# Patient Record
Sex: Female | Born: 1985 | Race: White | Hispanic: No | Marital: Single | Smoking: Current every day smoker
Health system: Southern US, Community
[De-identification: ages and names within clinical notes are randomized; demographics above are authoritative.]

## PROBLEM LIST (undated history)

## (undated) DIAGNOSIS — N939 Abnormal uterine and vaginal bleeding, unspecified: Secondary | ICD-10-CM

## (undated) DIAGNOSIS — F509 Eating disorder, unspecified: Secondary | ICD-10-CM

## (undated) DIAGNOSIS — R002 Palpitations: Secondary | ICD-10-CM

## (undated) DIAGNOSIS — K589 Irritable bowel syndrome without diarrhea: Secondary | ICD-10-CM

## (undated) DIAGNOSIS — F319 Bipolar disorder, unspecified: Secondary | ICD-10-CM

## (undated) HISTORY — PX: WISDOM TOOTH EXTRACTION: SHX21

---

## 2003-01-21 ENCOUNTER — Encounter: Admission: RE | Admit: 2003-01-21 | Discharge: 2003-04-21 | Payer: Self-pay | Admitting: Psychology

## 2003-07-06 ENCOUNTER — Emergency Department (HOSPITAL_COMMUNITY): Admission: AD | Admit: 2003-07-06 | Discharge: 2003-07-06 | Payer: Self-pay | Admitting: Family Medicine

## 2003-11-04 ENCOUNTER — Other Ambulatory Visit: Admission: RE | Admit: 2003-11-04 | Discharge: 2003-11-04 | Payer: Self-pay | Admitting: Obstetrics and Gynecology

## 2004-11-11 ENCOUNTER — Emergency Department (HOSPITAL_COMMUNITY): Admission: EM | Admit: 2004-11-11 | Discharge: 2004-11-11 | Payer: Self-pay | Admitting: Emergency Medicine

## 2014-02-22 ENCOUNTER — Emergency Department: Payer: Self-pay | Admitting: Emergency Medicine

## 2014-02-24 LAB — BETA STREP CULTURE(ARMC)

## 2014-04-17 ENCOUNTER — Emergency Department: Payer: Self-pay | Admitting: Emergency Medicine

## 2014-04-17 LAB — URINALYSIS, COMPLETE
Bacteria: NONE SEEN
Bilirubin,UR: NEGATIVE
Blood: NEGATIVE
Glucose,UR: NEGATIVE mg/dL (ref 0–75)
KETONE: NEGATIVE
NITRITE: NEGATIVE
PROTEIN: NEGATIVE
Ph: 6 (ref 4.5–8.0)
Specific Gravity: 1.016 (ref 1.003–1.030)
Squamous Epithelial: 3

## 2014-04-17 LAB — CBC
HCT: 43 % (ref 35.0–47.0)
HGB: 14.3 g/dL (ref 12.0–16.0)
MCH: 32.6 pg (ref 26.0–34.0)
MCHC: 33.2 g/dL (ref 32.0–36.0)
MCV: 98 fL (ref 80–100)
Platelet: 235 10*3/uL (ref 150–440)
RBC: 4.38 10*6/uL (ref 3.80–5.20)
RDW: 13.8 % (ref 11.5–14.5)
WBC: 8.4 10*3/uL (ref 3.6–11.0)

## 2014-04-17 LAB — WET PREP, GENITAL

## 2014-04-17 LAB — PREGNANCY, URINE: Pregnancy Test, Urine: NEGATIVE m[IU]/mL

## 2014-04-17 LAB — GC/CHLAMYDIA PROBE AMP

## 2014-08-30 ENCOUNTER — Emergency Department
Admission: EM | Admit: 2014-08-30 | Discharge: 2014-08-30 | Disposition: A | Discharge status: Home / Self Care | Payer: Medicaid Other | Attending: Emergency Medicine | Admitting: Emergency Medicine

## 2014-08-30 ENCOUNTER — Encounter: Payer: Self-pay | Admitting: Emergency Medicine

## 2014-08-30 DIAGNOSIS — S0501XA Injury of conjunctiva and corneal abrasion without foreign body, right eye, initial encounter: Secondary | ICD-10-CM | POA: Insufficient documentation

## 2014-08-30 DIAGNOSIS — Y9389 Activity, other specified: Secondary | ICD-10-CM | POA: Diagnosis not present

## 2014-08-30 DIAGNOSIS — W228XXA Striking against or struck by other objects, initial encounter: Secondary | ICD-10-CM | POA: Insufficient documentation

## 2014-08-30 DIAGNOSIS — Z791 Long term (current) use of non-steroidal anti-inflammatories (NSAID): Secondary | ICD-10-CM | POA: Diagnosis not present

## 2014-08-30 DIAGNOSIS — Y9289 Other specified places as the place of occurrence of the external cause: Secondary | ICD-10-CM | POA: Insufficient documentation

## 2014-08-30 DIAGNOSIS — S0591XA Unspecified injury of right eye and orbit, initial encounter: Secondary | ICD-10-CM | POA: Diagnosis present

## 2014-08-30 DIAGNOSIS — Z720 Tobacco use: Secondary | ICD-10-CM | POA: Insufficient documentation

## 2014-08-30 DIAGNOSIS — Y998 Other external cause status: Secondary | ICD-10-CM | POA: Diagnosis not present

## 2014-08-30 MED ORDER — TETRACAINE HCL 0.5 % OP SOLN
OPHTHALMIC | Status: AC
  Filled 2014-08-30: qty 2

## 2014-08-30 MED ORDER — ACETAMINOPHEN-CODEINE #4 300-60 MG PO TABS: 1.0000 | ORAL_TABLET | ORAL | Status: DC | PRN

## 2014-08-30 MED ORDER — CIPROFLOXACIN HCL 0.3 % OP SOLN
2.0000 [drp] | OPHTHALMIC | Status: DC
  Administered 2014-08-30: 2 [drp] via OPHTHALMIC

## 2014-08-30 MED ORDER — FLUORESCEIN SODIUM 1 MG OP STRP
ORAL_STRIP | OPHTHALMIC | Status: AC
  Filled 2014-08-30: qty 1

## 2014-08-30 MED ORDER — CIPROFLOXACIN HCL 0.3 % OP SOLN
OPHTHALMIC | Status: AC
  Filled 2014-08-30: qty 2.5

## 2014-08-30 MED ORDER — KETOROLAC TROMETHAMINE 0.5 % OP SOLN: 1.0000 [drp] | Freq: Four times a day (QID) | OPHTHALMIC | Status: DC

## 2014-08-30 NOTE — ED Provider Notes (Signed)
[facility] Emergency Department Provider Note  ____________________________________________  Time seen: Approximately 4:33 PM  I have reviewed the triage vital signs and the nursing notes.   HISTORY  Chief Complaint Eye Pain    HPI Jody Roberts is a 29 y.o. female who presents to the emergency department for pain to the right eye. She reports that she accidentally scraped it on a cardboard box this morning around 10 AM. She reports the pain has increased throughout the day. She describes a feeling of foreign body. She denies change in vision, however she reports that the eye is excruciatingly painful and is watering. She does not wear contacts. She was not wearing glasses when the incident occurred. She states that this happened at work.   No past medical history on file.  There are no active problems to display for this patient.   No past surgical history on file.  Current Outpatient Rx  Name  Route  Sig  Dispense  Refill  . acetaminophen-codeine (TYLENOL #4) 300-60 MG per tablet   Oral   Take 1 tablet by mouth every 4 (four) hours as needed for moderate pain.   12 tablet   0   . ketorolac (ACULAR) 0.5 % ophthalmic solution   Right Eye   Place 1 drop into the right eye 4 (four) times daily.   5 mL   0     Allergies Review of patient's allergies indicates no known allergies.  No family history on file.  Social History History  Substance Use Topics  . Smoking status: Current Every Day Smoker  . Smokeless tobacco: Not on file  . Alcohol Use: No    Review of Systems   Constitutional: No fever/chills Eyes: Visual changes: no. ENT: No sore throat. Cardiovascular: Denies chest pain. Respiratory: Denies shortness of breath. Gastrointestinal: No abdominal pain.  No nausea, no vomiting.  No diarrhea.  No constipation. Musculoskeletal: Negative for pain. Skin: Negative for rash. Neurological: Negative for headaches, focal weakness  or numbness. Psychiatric:At baseline, no complaint Lymphatic:Swollen nodes-- no Allergic: Seasonal allergies: no 10-point ROS otherwise negative.  ____________________________________________  PHYSICAL EXAM:  VITAL SIGNS: ED Triage Vitals  Enc Vitals Group     BP 08/30/14 1504 132/95 mmHg     Pulse Rate 08/30/14 1504 105     Resp 08/30/14 1504 18     Temp 08/30/14 1504 99.5 F (37.5 C)     Temp Source 08/30/14 1504 Oral     SpO2 08/30/14 1504 99 %     Weight 08/30/14 1504 115 lb (52.164 kg)     Height 08/30/14 1504 5\' 2"  (1.575 m)     Head Cir --      Peak Flow --      Pain Score 08/30/14 1505 9     Pain Loc --      Pain Edu? --      Excl. in GC? --     Constitutional: Alert and oriented. Well appearing and in no acute distress. Eyes: Visual acuity--see nursing documentation; No globe trauma; Eyelids normal to inspection; Everted for exam yes; Conjunctiva and sclera: Erythematous; Corneas: Corneal abrasion noted at the 11:00 position; Examined with fluorescein yes; EOM's intact; Pupils PERRLA; Anterior Chambers normal with limited exam.  Head: Atraumatic. Nose: No congestion/rhinnorhea. Mouth/Throat: Mucous membranes are moist.  Oropharynx non-erythematous. Neck: No stridor.  Cardiovascular:  Good peripheral circulation. Respiratory: Normal respiratory effort.  No retractions. Gastrointestinal: Soft and nontender. No distention.  Musculoskeletal:Normal ROM Neurologic:  Normal speech and language. No gross focal neurologic deficits are appreciated. Speech is normal. No gait instability. Skin:  Skin is warm, dry and intact. No rash noted. Psychiatric: Mood and affect are normal. Speech and behavior are normal.  ____________________________________________   LABS (all labs ordered are listed, but only abnormal results are displayed)  Labs Reviewed - No data to  display ____________________________________________  EKG   ____________________________________________  RADIOLOGY   ____________________________________________   PROCEDURES  Procedure(s) performed: None  ____________________________________________   INITIAL IMPRESSION / ASSESSMENT AND PLAN / ED COURSE  Pertinent labs & imaging results that were available during my care of the patient were reviewed by me and considered in my medical decision making (see chart for details).  Patient was advised to follow-up with ophthalmology in the next 2 days. She was advised to return to the emergency department for any symptom changes or worsen if stable schedule an appointment. ____________________________________________   FINAL CLINICAL IMPRESSION(S) / ED DIAGNOSES  Final diagnoses:  Corneal abrasion, right, initial encounter      Chinita Pester, FNP 08/30/14 1637  Loleta Rose, MD 08/31/14 0025

## 2014-08-30 NOTE — Discharge Instructions (Signed)
Corneal Abrasion °The cornea is the clear covering at the front and center of the eye. When looking at the colored portion of the eye (iris), you are looking through the cornea. This very thin tissue is made up of many layers. The surface layer is a single layer of cells (corneal epithelium) and is one of the most sensitive tissues in the body. If a scratch or injury causes the corneal epithelium to come off, it is called a corneal abrasion. If the injury extends to the tissues below the epithelium, the condition is called a corneal ulcer. °CAUSES  °· Scratches. °· Trauma. °· Foreign body in the eye. °Some people have recurrences of abrasions in the area of the original injury even after it has healed (recurrent erosion syndrome). Recurrent erosion syndrome generally improves and goes away with time. °SYMPTOMS  °· Eye pain. °· Difficulty or inability to keep the injured eye open. °· The eye becomes very sensitive to light. °· Recurrent erosions tend to happen suddenly, first thing in the morning, usually after waking up and opening the eye. °DIAGNOSIS  °Your health care provider can diagnose a corneal abrasion during an eye exam. Dye is usually placed in the eye using a drop or a small paper strip moistened by your tears. When the eye is examined with a special light, the abrasion shows up clearly because of the dye. °TREATMENT  °· Small abrasions may be treated with antibiotic drops or ointment alone. °· A pressure patch may be put over the eye. If this is done, follow your doctor's instructions for when to remove the patch. Do not drive or use machines while the eye patch is on. Judging distances is hard to do with a patch on. °If the abrasion becomes infected and spreads to the deeper tissues of the cornea, a corneal ulcer can result. This is serious because it can cause corneal scarring. Corneal scars interfere with light passing through the cornea and cause a loss of vision in the involved eye. °HOME CARE  INSTRUCTIONS °· Use medicine or ointment as directed. Only take over-the-counter or prescription medicines for pain, discomfort, or fever as directed by your health care provider. °· Do not drive or operate machinery if your eye is patched. Your ability to judge distances is impaired. °· If your health care provider has given you a follow-up appointment, it is very important to keep that appointment. Not keeping the appointment could result in a severe eye infection or permanent loss of vision. If there is any problem keeping the appointment, let your health care provider know. °SEEK MEDICAL CARE IF:  °· You have pain, light sensitivity, and a scratchy feeling in one eye or both eyes. °· Your pressure patch keeps loosening up, and you can blink your eye under the patch after treatment. °· Any kind of discharge develops from the eye after treatment or if the lids stick together in the morning. °· You have the same symptoms in the morning as you did with the original abrasion days, weeks, or months after the abrasion healed. °MAKE SURE YOU:  °· Understand these instructions. °· Will watch your condition. °· Will get help right away if you are not doing well or get worse. °Document Released: 03/23/2000 Document Revised: 03/31/2013 Document Reviewed: 12/01/2012 °[employer]® Patient Information ©2015 [employer]. This information is not intended to replace advice given to you by your health care provider. Make sure you discuss any questions you have with your health care provider. ° °

## 2014-08-30 NOTE — ED Notes (Signed)
Was bending over today hit right eye on corner of box, occurred at work but not w/c

## 2014-08-30 NOTE — ED Notes (Signed)
C/o right eye pain, states she went to get a box and the corner of the box went into her right eye

## 2014-12-29 ENCOUNTER — Encounter
Admission: RE | Admit: 2014-12-29 | Discharge: 2014-12-29 | Disposition: A | Discharge status: Home / Self Care | Payer: Medicaid Other | Source: Ambulatory Visit | Attending: Obstetrics & Gynecology | Admitting: Obstetrics & Gynecology

## 2014-12-29 DIAGNOSIS — N921 Excessive and frequent menstruation with irregular cycle: Secondary | ICD-10-CM | POA: Insufficient documentation

## 2014-12-29 DIAGNOSIS — Z01818 Encounter for other preprocedural examination: Secondary | ICD-10-CM | POA: Insufficient documentation

## 2014-12-29 HISTORY — DX: Palpitations: R00.2

## 2014-12-29 HISTORY — DX: Irritable bowel syndrome, unspecified: K58.9

## 2014-12-29 HISTORY — DX: Bipolar disorder, unspecified: F31.9

## 2014-12-29 HISTORY — DX: Abnormal uterine and vaginal bleeding, unspecified: N93.9

## 2014-12-29 LAB — CBC
HCT: 43.9 % (ref 35.0–47.0)
Hemoglobin: 14.9 g/dL (ref 12.0–16.0)
MCH: 32.1 pg (ref 26.0–34.0)
MCHC: 34 g/dL (ref 32.0–36.0)
MCV: 94.4 fL (ref 80.0–100.0)
PLATELETS: 320 10*3/uL (ref 150–440)
RBC: 4.65 MIL/uL (ref 3.80–5.20)
RDW: 13.3 % (ref 11.5–14.5)
WBC: 11 10*3/uL (ref 3.6–11.0)

## 2014-12-29 LAB — ABO/RH: ABO/RH(D): A POS

## 2014-12-29 LAB — TYPE AND SCREEN
ABO/RH(D): A POS
Antibody Screen: NEGATIVE

## 2014-12-29 NOTE — Patient Instructions (Signed)
  Your procedure is scheduled on: 01/06/15 Thur   Report to Day Surgery. To find out your arrival time please call [phone] between 1PM - 3PM on 01/05/15 Wed.  Remember: Instructions that are not followed completely may result in serious medical risk, up to and including death, or upon the discretion of your surgeon and anesthesiologist your surgery may need to be rescheduled.    _x__ 1. Do not eat food or drink liquids after midnight. No gum chewing or hard candies.     _x___ 2. No Alcohol for 24 hours before or after surgery.   ____ 3. Bring all medications with you on the day of surgery if instructed.    __x__ 4. Notify your doctor if there is any change in your medical condition     (cold, fever, infections).     Do not wear jewelry, make-up, hairpins, clips or nail polish.  Do not wear lotions, powders, or perfumes. You may wear deodorant.  Do not shave 48 hours prior to surgery. Men may shave face and neck.  Do not bring valuables to the hospital.    [facility] is not responsible for any belongings or valuables.               Contacts, dentures or bridgework may not be worn into surgery.  Leave your suitcase in the car. After surgery it may be brought to your room.  For patients admitted to the hospital, discharge time is determined by your                treatment team.   Patients discharged the day of surgery will not be allowed to drive home.   Please read over the following fact sheets that you were given:      _x___ Take these medicines the morning of surgery with A SIP OF WATER:    1. Vortioxetine HBr (TRINTELLIX) 10 MG TABS  2.   3.   4.  5.  6.  ____ Fleet Enema (as directed)   __x__ Use CHG Soap as directed  ____ Use inhalers on the day of surgery  ____ Stop metformin 2 days prior to surgery    ____ Take 1/2 of usual insulin dose the night before surgery and none on the morning of surgery.   ____ Stop Coumadin/Plavix/aspirin on   ____ Stop  Anti-inflammatories on    ____ Stop supplements until after surgery.    ____ Bring C-Pap to the hospital.

## 2015-01-06 ENCOUNTER — Ambulatory Visit
Admission: RE | Admit: 2015-01-06 | Discharge: 2015-01-06 | Disposition: A | Discharge status: Home / Self Care | Payer: Medicaid Other | Source: Ambulatory Visit | Attending: Obstetrics & Gynecology | Admitting: Obstetrics & Gynecology

## 2015-01-06 ENCOUNTER — Ambulatory Visit: Payer: Medicaid Other | Admitting: Anesthesiology

## 2015-01-06 ENCOUNTER — Encounter
Admission: RE | Disposition: A | Discharge status: Home / Self Care | Payer: Self-pay | Source: Ambulatory Visit | Attending: Obstetrics & Gynecology

## 2015-01-06 DIAGNOSIS — N921 Excessive and frequent menstruation with irregular cycle: Secondary | ICD-10-CM | POA: Insufficient documentation

## 2015-01-06 DIAGNOSIS — N840 Polyp of corpus uteri: Secondary | ICD-10-CM | POA: Diagnosis not present

## 2015-01-06 DIAGNOSIS — N72 Inflammatory disease of cervix uteri: Secondary | ICD-10-CM | POA: Diagnosis not present

## 2015-01-06 DIAGNOSIS — F319 Bipolar disorder, unspecified: Secondary | ICD-10-CM | POA: Diagnosis not present

## 2015-01-06 HISTORY — PX: LAPAROSCOPIC HYSTERECTOMY: SHX1926

## 2015-01-06 HISTORY — PX: CYSTOSCOPY: SHX5120

## 2015-01-06 LAB — POCT PREGNANCY, URINE: PREG TEST UR: NEGATIVE

## 2015-01-06 SURGERY — HYSTERECTOMY, TOTAL, LAPAROSCOPIC
Anesthesia: General | Laterality: Bilateral

## 2015-01-06 MED ORDER — ACETAMINOPHEN 10 MG/ML IV SOLN
INTRAVENOUS | Status: AC
  Filled 2015-01-06: qty 100

## 2015-01-06 MED ORDER — FENTANYL CITRATE (PF) 100 MCG/2ML IJ SOLN
INTRAMUSCULAR | Status: AC
  Administered 2015-01-06: 25 ug via INTRAVENOUS
  Filled 2015-01-06: qty 2

## 2015-01-06 MED ORDER — ROCURONIUM BROMIDE 100 MG/10ML IV SOLN
INTRAVENOUS | Status: DC | PRN
  Administered 2015-01-06: 50 mg via INTRAVENOUS

## 2015-01-06 MED ORDER — ONDANSETRON HCL 4 MG/2ML IJ SOLN: 4.0000 mg | Freq: Once | INTRAMUSCULAR | Status: DC | PRN

## 2015-01-06 MED ORDER — LACTATED RINGERS IV SOLN
INTRAVENOUS | Status: DC
  Administered 2015-01-06: 11:00:00 via INTRAVENOUS

## 2015-01-06 MED ORDER — LIDOCAINE HCL (CARDIAC) 20 MG/ML IV SOLN
INTRAVENOUS | Status: DC | PRN
  Administered 2015-01-06 (×2): 100 mg via INTRAVENOUS

## 2015-01-06 MED ORDER — BUPIVACAINE HCL (PF) 0.5 % IJ SOLN
INTRAMUSCULAR | Status: DC | PRN
  Administered 2015-01-06: 6 mL

## 2015-01-06 MED ORDER — NEOSTIGMINE METHYLSULFATE 10 MG/10ML IV SOLN
INTRAVENOUS | Status: DC | PRN
  Administered 2015-01-06: 3 mg via INTRAVENOUS

## 2015-01-06 MED ORDER — ACETAMINOPHEN 10 MG/ML IV SOLN
INTRAVENOUS | Status: DC | PRN
  Administered 2015-01-06: 1000 mg via INTRAVENOUS

## 2015-01-06 MED ORDER — MIDAZOLAM HCL 2 MG/2ML IJ SOLN
INTRAMUSCULAR | Status: DC | PRN
  Administered 2015-01-06: 2 mg via INTRAVENOUS

## 2015-01-06 MED ORDER — CEFOXITIN SODIUM-DEXTROSE 2-2.2 GM-% IV SOLR (PREMIX)
2.0000 g | Freq: Once | INTRAVENOUS | Status: AC
  Administered 2015-01-06: 2000 mg via INTRAVENOUS

## 2015-01-06 MED ORDER — GLYCOPYRROLATE 0.2 MG/ML IJ SOLN
INTRAMUSCULAR | Status: DC | PRN
  Administered 2015-01-06: .5 mg via INTRAVENOUS

## 2015-01-06 MED ORDER — FENTANYL CITRATE (PF) 100 MCG/2ML IJ SOLN
25.0000 ug | INTRAMUSCULAR | Status: AC | PRN
  Administered 2015-01-06 (×6): 25 ug via INTRAVENOUS

## 2015-01-06 MED ORDER — FAMOTIDINE 20 MG PO TABS
20.0000 mg | ORAL_TABLET | Freq: Once | ORAL | Status: AC
  Administered 2015-01-06: 20 mg via ORAL

## 2015-01-06 MED ORDER — KETOROLAC TROMETHAMINE 30 MG/ML IJ SOLN
INTRAMUSCULAR | Status: AC
  Administered 2015-01-06: 30 mg via INTRAVENOUS
  Filled 2015-01-06: qty 1

## 2015-01-06 MED ORDER — FENTANYL CITRATE (PF) 100 MCG/2ML IJ SOLN
INTRAMUSCULAR | Status: DC | PRN
  Administered 2015-01-06: 100 ug via INTRAVENOUS
  Administered 2015-01-06: 150 ug via INTRAVENOUS
  Administered 2015-01-06: 100 ug via INTRAVENOUS

## 2015-01-06 MED ORDER — KETOROLAC TROMETHAMINE 30 MG/ML IJ SOLN
30.0000 mg | Freq: Four times a day (QID) | INTRAMUSCULAR | Status: DC
  Administered 2015-01-06: 30 mg via INTRAVENOUS
  Filled 2015-01-06 (×5): qty 1

## 2015-01-06 MED ORDER — CEFOXITIN SODIUM-DEXTROSE 2-2.2 GM-% IV SOLR (PREMIX)
INTRAVENOUS | Status: AC
  Administered 2015-01-06: 2000 mg via INTRAVENOUS
  Filled 2015-01-06: qty 50

## 2015-01-06 MED ORDER — FAMOTIDINE 20 MG PO TABS
ORAL_TABLET | ORAL | Status: AC
  Administered 2015-01-06: 20 mg via ORAL
  Filled 2015-01-06: qty 1

## 2015-01-06 MED ORDER — ONDANSETRON HCL 4 MG/2ML IJ SOLN: 4.0000 mg | Freq: Four times a day (QID) | INTRAMUSCULAR | Status: DC | PRN

## 2015-01-06 MED ORDER — ONDANSETRON HCL 4 MG/2ML IJ SOLN
INTRAMUSCULAR | Status: DC | PRN
  Administered 2015-01-06: 4 mg via INTRAVENOUS

## 2015-01-06 MED ORDER — PROPOFOL 10 MG/ML IV BOLUS
INTRAVENOUS | Status: DC | PRN
  Administered 2015-01-06: 150 mg via INTRAVENOUS

## 2015-01-06 MED ORDER — BUPIVACAINE HCL (PF) 0.5 % IJ SOLN
INTRAMUSCULAR | Status: AC
  Filled 2015-01-06: qty 30

## 2015-01-06 SURGICAL SUPPLY — 45 items
BAG URO DRAIN 2000ML W/SPOUT (MISCELLANEOUS) ×4 IMPLANT
BLADE SURG SZ11 CARB STEEL (BLADE) ×4 IMPLANT
CANISTER SUCT 1200ML W/VALVE (MISCELLANEOUS) ×4 IMPLANT
CATH FOLEY 2WAY  5CC 16FR (CATHETERS) ×2
CATH FOLEY 2WAY 5CC 16FR (CATHETERS) ×2
CATH URTH 16FR FL 2W BLN LF (CATHETERS) ×2 IMPLANT
CHLORAPREP W/TINT 26ML (MISCELLANEOUS) ×4 IMPLANT
DEFOGGER SCOPE WARMER CLEARIFY (MISCELLANEOUS) ×4 IMPLANT
DEVICE SUTURE ENDOST 10MM (ENDOMECHANICALS) ×2 IMPLANT
DRSG TEGADERM 2-3/8X2-3/4 SM (GAUZE/BANDAGES/DRESSINGS) ×6 IMPLANT
ENDOSTITCH 0 SINGLE 48 (SUTURE) ×16 IMPLANT
GAUZE SPONGE NON-WVN 2X2 STRL (MISCELLANEOUS) IMPLANT
GLOVE BIO SURGEON STRL SZ8 (GLOVE) ×20 IMPLANT
GLOVE INDICATOR 8.0 STRL GRN (GLOVE) ×12 IMPLANT
GOWN STRL REUS W/ TWL LRG LVL3 (GOWN DISPOSABLE) ×2 IMPLANT
GOWN STRL REUS W/ TWL XL LVL3 (GOWN DISPOSABLE) ×2 IMPLANT
GOWN STRL REUS W/TWL LRG LVL3 (GOWN DISPOSABLE) ×4
GOWN STRL REUS W/TWL XL LVL3 (GOWN DISPOSABLE) ×4
IRRIGATION STRYKERFLOW (MISCELLANEOUS) ×2 IMPLANT
IRRIGATOR STRYKERFLOW (MISCELLANEOUS) ×4
IV LACTATED RINGERS 1000ML (IV SOLUTION) ×6 IMPLANT
KIT RM TURNOVER CYSTO AR (KITS) ×4 IMPLANT
LABEL OR SOLS (LABEL) ×4 IMPLANT
LIQUID BAND (GAUZE/BANDAGES/DRESSINGS) ×4 IMPLANT
MANIPULATOR VCARE LG CRV RETR (MISCELLANEOUS) ×2 IMPLANT
MANIPULATOR VCARE STD CRV RETR (MISCELLANEOUS) IMPLANT
NEEDLE VERESS 14GA 120MM (NEEDLE) ×4 IMPLANT
NS IRRIG 500ML POUR BTL (IV SOLUTION) ×4 IMPLANT
OCCLUDER COLPOPNEUMO (BALLOONS) ×4 IMPLANT
PACK GYN LAPAROSCOPIC (MISCELLANEOUS) ×4 IMPLANT
PAD OB MATERNITY 4.3X12.25 (PERSONAL CARE ITEMS) ×4 IMPLANT
PAD PREP 24X41 OB/GYN DISP (PERSONAL CARE ITEMS) ×4 IMPLANT
SCISSORS METZENBAUM CVD 33 (INSTRUMENTS) ×2 IMPLANT
SET CYSTO W/LG BORE CLAMP LF (SET/KITS/TRAYS/PACK) ×4 IMPLANT
SHEARS HARMONIC ACE PLUS 36CM (ENDOMECHANICALS) ×4 IMPLANT
SLEEVE ENDOPATH XCEL 5M (ENDOMECHANICALS) ×4 IMPLANT
SPONGE LAP 18X18 5 PK (GAUZE/BANDAGES/DRESSINGS) ×4 IMPLANT
SPONGE VERSALON 2X2 STRL (MISCELLANEOUS) ×12
SUT VIC AB 0 CT1 36 (SUTURE) ×2 IMPLANT
SUT VIC AB 2-0 UR6 27 (SUTURE) IMPLANT
SYR 50ML LL SCALE MARK (SYRINGE) ×4 IMPLANT
SYRINGE 10CC LL (SYRINGE) ×4 IMPLANT
TROCAR ENDO BLADELESS 11MM (ENDOMECHANICALS) ×4 IMPLANT
TROCAR XCEL NON-BLD 5MMX100MML (ENDOMECHANICALS) ×4 IMPLANT
TUBING INSUFFLATOR HEATED (MISCELLANEOUS) ×4 IMPLANT

## 2015-01-06 NOTE — Discharge Instructions (Signed)
Total Laparoscopic Hysterectomy, Care After Refer to this sheet in the next few weeks. These instructions provide you with information on caring for yourself after your procedure. Your health care provider may also give you more specific instructions. Your treatment has been planned according to current medical practices, but problems sometimes occur. Call your health care provider if you have any problems or questions after your procedure. WHAT TO EXPECT AFTER THE PROCEDURE  Pain and bruising at the incision sites. You will be given pain medicine to control it.  Menopausal symptoms such as hot flashes, night sweats, and insomnia if your ovaries were removed.  Sore throat from the breathing tube that was inserted during surgery. HOME CARE INSTRUCTIONS  Only take over-the-counter or prescription medicines for pain, discomfort, or fever as directed by your health care provider.   Do not take aspirin. It can cause bleeding.   Do not drive when taking pain medicine.   Follow your health care provider's advice regarding diet, exercise, lifting, driving, and general activities.   Resume your usual diet as directed and allowed.   Get plenty of rest and sleep.   Do not douche, use tampons, or have sexual intercourse for at least 6 weeks, or until your health care provider gives you permission.   Change your bandages (dressings) as directed by your health care provider.   Monitor your temperature and notify your health care provider of a fever.   Take showers instead of baths for 3-5 days.   Do not drink alcohol until your health care provider gives you permission.   If you develop constipation, you may take a mild laxative with your health care provider's permission. Bran foods may help with constipation problems. Drinking enough fluids to keep your urine clear or pale yellow may help as well.   Try to have someone home with you for 1-2 days to help around the house.   Keep  all of your follow-up appointments as directed by your health care provider.  SEEK MEDICAL CARE IF:  You have swelling, redness, or increasing pain around your incision sites.   You have pus coming from your incision.   You notice a bad smell coming from your incision.   Your incision breaks open.   You feel dizzy or lightheaded.   You have pain or bleeding when you urinate.   You have persistent diarrhea.   You have persistent nausea and vomiting.   You have abnormal vaginal discharge.   You have a rash.   You have any type of abnormal reaction or develop an allergy to your medicine.   You have poor pain control with your prescribed medicine.  SEEK IMMEDIATE MEDICAL CARE IF:  You have chest pain or shortness of breath.  You have severe abdominal pain that is not relieved with pain medicine.  You have pain or swelling in your legs. MAKE SURE YOU:  Understand these instructions.  Will watch your condition.  Will get help right away if you are not doing well or get worse. Document Released: 01/14/2013 Document Revised: 03/31/2013 Document Reviewed: 01/14/2013 [facility] Patient Information 2015 [place], [place]. This information is not intended to replace advice given to you by your health care provider. Make sure you discuss any questions you have with your health care provider.

## 2015-01-06 NOTE — Op Note (Signed)
Operative Note:  PRE-OP DIAGNOSIS: menometrorrhagia   POST-OP DIAGNOSIS: menometrorrhagia   PROCEDURE: Procedure(s): HYSTERECTOMY - TOTAL LAPAROSCOPIC with BILATERAL SALPINGECTOMY CYSTOSCOPY  SURGEON: Annamarie Major, MD, FACOG  ASSISTANT: Dr Ward   ANESTHESIA: General endotracheal anesthesia  ESTIMATED BLOOD LOSS: Minimal  SPECIMENS: Uterus, Tubes.  COMPLICATIONS: None  DISPOSITION: stable to PACU  FINDINGS: Intraabdominal adhesions were not noted. Normal ovaries.  PROCEDURE:  The patient was taken to the OR where anesthesia was administed. She was prepped and draped in the normal sterile fashion in the dorsal lithotomy position in the [place] stirrups. A time out was performed. A Graves speculum was inserted, the cervix was grasped with a single tooth tenaculum and the endometrial cavity was sounded. The cervix was sounded to 8 cm. A V-Care uterine manipulator was inserted in the usual fashion without incident. Gloves were changed and attention was turned to the abdomen.   An infraumbilical transverse 5mm skin incision was made with the scalpel after local anesthesia applied to the skin. A Veress-step needle was inserted in the usual fashion and confirmed using the hanging drop technique. A pneumoperitoneum was obtained by insufflation of CO2 (opening pressure of ) to . A diagnostic laparoscopy was performed yielding the previously described findings. Attention was turned to the left lower quadrant where after visualization of the inferior epigastric vessels a 5mm skin incision was made with the scalpel. A 5 mm laparoscopic port was inserted. The same procedure was repeated in the right lower quadrant with a 11mm trocar. Attention was turned to the left aspect of the uterus, where after visualization of the ureter, the round ligament was coagulated and transected using the 5mm Harmonic Scapel. The anterior and posterior leafs of the broad ligament were dissected off as the  anterior one was coagulated and transected in a caudal direction towards the cuff of the uterine manipulator. The vesicouterine reflection of the peritoneum was dissected with the harmonic scapel and the bladder flap was created bluntly. Attention was then turned to the left fallopian tube which was recognized by visualization of the fimbria. First the mesosalpinx and then the utero-ovarian ligament were coagulated and transected using the Harmonic Scapel. Attention was turned to the right aspect of the uterus where the same procedure was performed. The uterine vessels were sealed and transected bilaterally using first bipolar cautery and then the harmonic scapel. A 360 degree, circumferential colpotomy was done to completely amputate the uterus with cervix and tubes. Once the specimen was amputated it was delivered through the vagina.   The colpotomy was repaired in a simple interrupted ashion using a 0-Polysorb suture with an endo-stitch device.  Vaginal exam confirms complete closure.  The cavity was copiously irrigated. A survey of the pelvic cavity revealed adequate hemostasis and no injury to bowel, bladder, or ureter.  A diagnostic cystoscopy was performed using saline distension of bladder, with bilateral urine flow from each ureteral orifice.  No bladder lesions or injuries seen.    At this point the procedure was finalized. All the instruments were removed from the patient's body. Gas was expelled and patient is leveled.  Incisions are closed with skin adhesive.    Patient goes to recovery room in stable condition.  All sponge, instrument, and needle counts are correct x2.

## 2015-01-06 NOTE — Anesthesia Postprocedure Evaluation (Signed)
  Anesthesia Post-op Note  Patient: Jody Roberts  Procedure(s) Performed: Procedure(s): HYSTERECTOMY TOTAL LAPAROSCOPIC/BILATERAL SALPINECTOMY (Bilateral) CYSTOSCOPY  Anesthesia type:General  Patient location: PACU  Post pain: Pain level controlled  Post assessment: Post-op Vital signs reviewed, Patient's Cardiovascular Status Stable, Respiratory Function Stable, Patent Airway and No signs of Nausea or vomiting  Post vital signs: Reviewed and stable  Last Vitals:  Filed Vitals:   01/06/15 1637  BP: 120/60  Pulse: 78  Temp: 36.8 C  Resp: 16    Level of consciousness: awake, alert  and patient cooperative  Complications: No apparent anesthesia complications

## 2015-01-06 NOTE — H&P (Signed)
History and Physical Interval Note:  01/06/2015 12:42 PM  Jody Roberts  has presented today for surgery, with the diagnosis of menometrorrhagia  The various methods of treatment have been discussed with the patient and family. After consideration of risks, benefits and other options for treatment, the patient has consented to  Procedure(s): HYSTERECTOMY TOTAL LAPAROSCOPIC/BILATERAL SALPINECTOMY (Bilateral) as a surgical intervention .  The patient's history has been reviewed, patient examined, no change in status, stable for surgery.  Pt has the following beta blocker history-  Not taking Beta Blocker.  I have reviewed the patient's chart and labs.  Questions were answered to the patient's satisfaction.     HARRIS,ROBERT Renae Fickle

## 2015-01-06 NOTE — Transfer of Care (Signed)
Immediate Anesthesia Transfer of Care Note  Patient: Jody Roberts  Procedure(s) Performed: Procedure(s): HYSTERECTOMY TOTAL LAPAROSCOPIC/BILATERAL SALPINECTOMY (Bilateral) CYSTOSCOPY  Patient Location: PACU  Anesthesia Type:General  Level of Consciousness: awake, alert , oriented and patient cooperative  Airway & Oxygen Therapy: Patient Spontanous Breathing and Patient connected to nasal cannula oxygen  Post-op Assessment: Report given to RN and Post -op Vital signs reviewed and stable  Post vital signs: Reviewed and stable  Last Vitals:  Filed Vitals:   01/06/15 1551  BP: 138/83  Pulse:   Temp: 36.4 C  Resp: 16    Complications: No apparent anesthesia complications

## 2015-01-06 NOTE — Anesthesia Procedure Notes (Signed)
Procedure Name: Intubation Date/Time: 01/06/2015 1:51 PM Performed by: Shirlee Limerick, DAVID Pre-anesthesia Checklist: Patient identified, Emergency Drugs available, Suction available and Patient being monitored Patient Re-evaluated:Patient Re-evaluated prior to inductionOxygen Delivery Method: Circle system utilized Preoxygenation: Pre-oxygenation with 100% oxygen Intubation Type: IV induction Laryngoscope Size: Mac and 3 Grade View: Grade I Tube type: Oral Number of attempts: 1 Secured at: 21 cm Dental Injury: Teeth and Oropharynx as per pre-operative assessment

## 2015-01-06 NOTE — Anesthesia Preprocedure Evaluation (Signed)
Anesthesia Evaluation  Patient identified by MRN, date of birth, ID band Patient awake    Reviewed: Allergy & Precautions, NPO status , Patient's Chart, lab work & pertinent test results  History of Anesthesia Complications Negative for: history of anesthetic complications  Airway Mallampati: I  TM Distance: >3 FB Neck ROM: Full    Dental  (+) Teeth Intact   Pulmonary           Cardiovascular      Neuro/Psych Bipolar Disorder    GI/Hepatic negative GI ROS, Neg liver ROS,   Endo/Other  negative endocrine ROS  Renal/GU negative Renal ROS     Musculoskeletal   Abdominal   Peds  Hematology negative hematology ROS (+)   Anesthesia Other Findings   Reproductive/Obstetrics                             Anesthesia Physical Anesthesia Plan  ASA: II  Anesthesia Plan: General   Post-op Pain Management:    Induction: Intravenous  Airway Management Planned: Oral ETT  Additional Equipment:   Intra-op Plan:   Post-operative Plan:   Informed Consent: I have reviewed the patients History and Physical, chart, labs and discussed the procedure including the risks, benefits and alternatives for the proposed anesthesia with the patient or authorized representative who has indicated his/her understanding and acceptance.     Plan Discussed with:   Anesthesia Plan Comments:         Anesthesia Quick Evaluation

## 2015-01-07 ENCOUNTER — Encounter: Payer: Self-pay | Admitting: Obstetrics & Gynecology

## 2015-01-10 LAB — SURGICAL PATHOLOGY

## 2015-08-17 ENCOUNTER — Emergency Department: Payer: Medicaid Other

## 2015-08-17 ENCOUNTER — Encounter: Payer: Self-pay | Admitting: *Deleted

## 2015-08-17 ENCOUNTER — Emergency Department
Admission: EM | Admit: 2015-08-17 | Discharge: 2015-08-17 | Disposition: A | Discharge status: Home / Self Care | Payer: Medicaid Other | Attending: Emergency Medicine | Admitting: Emergency Medicine

## 2015-08-17 DIAGNOSIS — R1010 Upper abdominal pain, unspecified: Secondary | ICD-10-CM | POA: Diagnosis present

## 2015-08-17 DIAGNOSIS — R109 Unspecified abdominal pain: Secondary | ICD-10-CM

## 2015-08-17 DIAGNOSIS — F17210 Nicotine dependence, cigarettes, uncomplicated: Secondary | ICD-10-CM | POA: Diagnosis not present

## 2015-08-17 DIAGNOSIS — N390 Urinary tract infection, site not specified: Secondary | ICD-10-CM | POA: Diagnosis not present

## 2015-08-17 DIAGNOSIS — F319 Bipolar disorder, unspecified: Secondary | ICD-10-CM | POA: Diagnosis not present

## 2015-08-17 DIAGNOSIS — N83201 Unspecified ovarian cyst, right side: Secondary | ICD-10-CM

## 2015-08-17 DIAGNOSIS — Z79899 Other long term (current) drug therapy: Secondary | ICD-10-CM | POA: Diagnosis not present

## 2015-08-17 LAB — CBC
HCT: 48.3 % — ABNORMAL HIGH (ref 35.0–47.0)
HEMOGLOBIN: 16.7 g/dL — AB (ref 12.0–16.0)
MCH: 31.5 pg (ref 26.0–34.0)
MCHC: 34.5 g/dL (ref 32.0–36.0)
MCV: 91.3 fL (ref 80.0–100.0)
Platelets: 345 10*3/uL (ref 150–440)
RBC: 5.29 MIL/uL — AB (ref 3.80–5.20)
RDW: 13.8 % (ref 11.5–14.5)
WBC: 15.5 10*3/uL — ABNORMAL HIGH (ref 3.6–11.0)

## 2015-08-17 LAB — URINALYSIS COMPLETE WITH MICROSCOPIC (ARMC ONLY)
Bilirubin Urine: NEGATIVE
Glucose, UA: NEGATIVE mg/dL
Nitrite: NEGATIVE
PROTEIN: 100 mg/dL — AB
SPECIFIC GRAVITY, URINE: 1.021 (ref 1.005–1.030)
pH: 6 (ref 5.0–8.0)

## 2015-08-17 LAB — COMPREHENSIVE METABOLIC PANEL
ALT: 12 U/L — AB (ref 14–54)
ANION GAP: 12 (ref 5–15)
AST: 22 U/L (ref 15–41)
Albumin: 5.2 g/dL — ABNORMAL HIGH (ref 3.5–5.0)
Alkaline Phosphatase: 45 U/L (ref 38–126)
BUN: 7 mg/dL (ref 6–20)
CHLORIDE: 103 mmol/L (ref 101–111)
CO2: 24 mmol/L (ref 22–32)
Calcium: 9.9 mg/dL (ref 8.9–10.3)
Creatinine, Ser: 0.78 mg/dL (ref 0.44–1.00)
GFR calc non Af Amer: >60 mL/min (ref >60)
Glucose, Bld: 126 mg/dL — ABNORMAL HIGH (ref 65–99)
Potassium: 3.6 mmol/L (ref 3.5–5.1)
Sodium: 139 mmol/L (ref 135–145)
Total Bilirubin: 1.3 mg/dL — ABNORMAL HIGH (ref 0.3–1.2)
Total Protein: 8.9 g/dL — ABNORMAL HIGH (ref 6.5–8.1)

## 2015-08-17 LAB — LIPASE, BLOOD: LIPASE: 18 U/L (ref 11–51)

## 2015-08-17 MED ORDER — OXYCODONE-ACETAMINOPHEN 5-325 MG PO TABS: 2.0000 | ORAL_TABLET | Freq: Four times a day (QID) | ORAL | Status: DC | PRN

## 2015-08-17 MED ORDER — SULFAMETHOXAZOLE-TRIMETHOPRIM 800-160 MG PO TABS: 1.0000 | ORAL_TABLET | Freq: Two times a day (BID) | ORAL | Status: DC

## 2015-08-17 MED ORDER — DIATRIZOATE MEGLUMINE & SODIUM 66-10 % PO SOLN
15.0000 mL | Freq: Once | ORAL | Status: AC
  Administered 2015-08-17: 15 mL via ORAL

## 2015-08-17 MED ORDER — MORPHINE SULFATE (PF) 4 MG/ML IV SOLN
INTRAVENOUS | Status: AC
  Administered 2015-08-17: 4 mg via INTRAVENOUS
  Filled 2015-08-17: qty 1

## 2015-08-17 MED ORDER — ONDANSETRON HCL 4 MG/2ML IJ SOLN
4.0000 mg | Freq: Once | INTRAMUSCULAR | Status: AC
  Administered 2015-08-17: 4 mg via INTRAVENOUS

## 2015-08-17 MED ORDER — DEXTROSE 5 % IV SOLN
1.0000 g | Freq: Once | INTRAVENOUS | Status: AC
  Administered 2015-08-17: 1 g via INTRAVENOUS
  Filled 2015-08-17 (×2): qty 10

## 2015-08-17 MED ORDER — IOPAMIDOL (ISOVUE-300) INJECTION 61%
100.0000 mL | Freq: Once | INTRAVENOUS | Status: AC | PRN
  Administered 2015-08-17: 100 mL via INTRAVENOUS
  Filled 2015-08-17: qty 100

## 2015-08-17 MED ORDER — PROMETHAZINE HCL 25 MG PO TABS: 25.0000 mg | ORAL_TABLET | Freq: Four times a day (QID) | ORAL | Status: DC | PRN

## 2015-08-17 MED ORDER — ONDANSETRON HCL 4 MG/2ML IJ SOLN
INTRAMUSCULAR | Status: AC
  Administered 2015-08-17: 4 mg via INTRAVENOUS
  Filled 2015-08-17: qty 2

## 2015-08-17 MED ORDER — MORPHINE SULFATE (PF) 4 MG/ML IV SOLN
4.0000 mg | Freq: Once | INTRAVENOUS | Status: AC
  Administered 2015-08-17: 4 mg via INTRAVENOUS

## 2015-08-17 NOTE — ED Notes (Signed)
States last night began vomiting and nausea and severe abd cramps and upper abd pain, pt had hysterectomy in sept but never went to her follow up, pt tearful, states she feels constipated

## 2015-08-17 NOTE — Discharge Instructions (Signed)
Abdominal Pain, Adult Many things can cause abdominal pain. Usually, abdominal pain is not caused by a disease and will improve without treatment. It can often be observed and treated at home. Your health care provider will do a physical exam and possibly order blood tests and X-rays to help determine the seriousness of your pain. However, in many cases, more time must pass before a clear cause of the pain can be found. Before that point, your health care provider may not know if you need more testing or further treatment. HOME CARE INSTRUCTIONS Monitor your abdominal pain for any changes. The following actions may help to alleviate any discomfort you are experiencing:  Only take over-the-counter or prescription medicines as directed by your health care provider.  Do not take laxatives unless directed to do so by your health care provider.  Try a clear liquid diet (broth, tea, or water) as directed by your health care provider. Slowly move to a bland diet as tolerated. SEEK MEDICAL CARE IF:  You have unexplained abdominal pain.  You have abdominal pain associated with nausea or diarrhea.  You have pain when you urinate or have a bowel movement.  You experience abdominal pain that wakes you in the night.  You have abdominal pain that is worsened or improved by eating food.  You have abdominal pain that is worsened with eating fatty foods.  You have a fever. SEEK IMMEDIATE MEDICAL CARE IF:  Your pain does not go away within 2 hours.  You keep throwing up (vomiting).  Your pain is felt only in portions of the abdomen, such as the right side or the left lower portion of the abdomen.  You pass bloody or black tarry stools. MAKE SURE YOU:  Understand these instructions.  Will watch your condition.  Will get help right away if you are not doing well or get worse.   This information is not intended to replace advice given to you by your health care provider. Make sure you discuss  any questions you have with your health care provider.   Document Released: 01/03/2005 Document Revised: 12/15/2014 Document Reviewed: 12/03/2012 Elsevier Interactive Patient Education 2016 Elsevier Inc.  Urinary Tract Infection Urinary tract infections (UTIs) can develop anywhere along your urinary tract. Your urinary tract is your body's drainage system for removing wastes and extra water. Your urinary tract includes two kidneys, two ureters, a bladder, and a urethra. Your kidneys are a pair of bean-shaped organs. Each kidney is about the size of your fist. They are located below your ribs, one on each side of your spine. CAUSES Infections are caused by microbes, which are microscopic organisms, including fungi, viruses, and bacteria. These organisms are so small that they can only be seen through a microscope. Bacteria are the microbes that most commonly cause UTIs. SYMPTOMS  Symptoms of UTIs may vary by age and gender of the patient and by the location of the infection. Symptoms in young women typically include a frequent and intense urge to urinate and a painful, burning feeling in the bladder or urethra during urination. Older women and men are more likely to be tired, shaky, and weak and have muscle aches and abdominal pain. A fever may mean the infection is in your kidneys. Other symptoms of a kidney infection include pain in your back or sides below the ribs, nausea, and vomiting. DIAGNOSIS To diagnose a UTI, your caregiver will ask you about your symptoms. Your caregiver will also ask you to provide a urine sample.  The urine sample will be tested for bacteria and white blood cells. White blood cells are made by your body to help fight infection. TREATMENT  Typically, UTIs can be treated with medication. Because most UTIs are caused by a bacterial infection, they usually can be treated with the use of antibiotics. The choice of antibiotic and length of treatment depend on your symptoms and  the type of bacteria causing your infection. HOME CARE INSTRUCTIONS  If you were prescribed antibiotics, take them exactly as your caregiver instructs you. Finish the medication even if you feel better after you have only taken some of the medication.  Drink enough water and fluids to keep your urine clear or pale yellow.  Avoid caffeine, tea, and carbonated beverages. They tend to irritate your bladder.  Empty your bladder often. Avoid holding urine for long periods of time.  Empty your bladder before and after sexual intercourse.  After a bowel movement, women should cleanse from front to back. Use each tissue only once. SEEK MEDICAL CARE IF:   You have back pain.  You develop a fever.  Your symptoms do not begin to resolve within 3 days. SEEK IMMEDIATE MEDICAL CARE IF:   You have severe back pain or lower abdominal pain.  You develop chills.  You have nausea or vomiting.  You have continued burning or discomfort with urination. MAKE SURE YOU:   Understand these instructions.  Will watch your condition.  Will get help right away if you are not doing well or get worse.   This information is not intended to replace advice given to you by your health care provider. Make sure you discuss any questions you have with your health care provider.   Document Released: 01/03/2005 Document Revised: 12/15/2014 Document Reviewed: 05/04/2011 Elsevier Interactive Patient Education 2016 Elsevier Inc. Ovarian Cyst An ovarian cyst is a fluid-filled sac that forms on an ovary. The ovaries are small organs that produce eggs in women. Various types of cysts can form on the ovaries. Most are not cancerous. Many do not cause problems, and they often go away on their own. Some may cause symptoms and require treatment. Common types of ovarian cysts include:  Functional cysts--These cysts may occur every month during the menstrual cycle. This is normal. The cysts usually go away with the next  menstrual cycle if the woman does not get pregnant. Usually, there are no symptoms with a functional cyst.  Endometrioma cysts--These cysts form from the tissue that lines the uterus. They are also called "chocolate cysts" because they become filled with blood that turns brown. This type of cyst can cause pain in the lower abdomen during intercourse and with your menstrual period.  Cystadenoma cysts--This type develops from the cells on the outside of the ovary. These cysts can get very big and cause lower abdomen pain and pain with intercourse. This type of cyst can twist on itself, cut off its blood supply, and cause severe pain. It can also easily rupture and cause a lot of pain.  Dermoid cysts--This type of cyst is sometimes found in both ovaries. These cysts may contain different kinds of body tissue, such as skin, teeth, hair, or cartilage. They usually do not cause symptoms unless they get very big.  Theca lutein cysts--These cysts occur when too much of a certain hormone (human chorionic gonadotropin) is produced and overstimulates the ovaries to produce an egg. This is most common after procedures used to assist with the conception of a baby (in vitro  fertilization). CAUSES   Fertility drugs can cause a condition in which multiple large cysts are formed on the ovaries. This is called ovarian hyperstimulation syndrome.  A condition called polycystic ovary syndrome can cause hormonal imbalances that can lead to nonfunctional ovarian cysts. SIGNS AND SYMPTOMS  Many ovarian cysts do not cause symptoms. If symptoms are present, they may include:  Pelvic pain or pressure.  Pain in the lower abdomen.  Pain during sexual intercourse.  Increasing girth (swelling) of the abdomen.  Abnormal menstrual periods.  Increasing pain with menstrual periods.  Stopping having menstrual periods without being pregnant. DIAGNOSIS  These cysts are commonly found during a routine or annual pelvic exam.  Tests may be ordered to find out more about the cyst. These tests may include:  Ultrasound.  X-ray of the pelvis.  CT scan.  MRI.  Blood tests. TREATMENT  Many ovarian cysts go away on their own without treatment. Your health care provider may want to check your cyst regularly for 2-3 months to see if it changes. For women in menopause, it is particularly important to monitor a cyst closely because of the higher rate of ovarian cancer in menopausal women. When treatment is needed, it may include any of the following:  A procedure to drain the cyst (aspiration). This may be done using a long needle and ultrasound. It can also be done through a laparoscopic procedure. This involves using a thin, lighted tube with a tiny camera on the end (laparoscope) inserted through a small incision.  Surgery to remove the whole cyst. This may be done using laparoscopic surgery or an open surgery involving a larger incision in the lower abdomen.  Hormone treatment or birth control pills. These methods are sometimes used to help dissolve a cyst. HOME CARE INSTRUCTIONS   Only take over-the-counter or prescription medicines as directed by your health care provider.  Follow up with your health care provider as directed.  Get regular pelvic exams and Pap tests. SEEK MEDICAL CARE IF:   Your periods are late, irregular, or painful, or they stop.  Your pelvic pain or abdominal pain does not go away.  Your abdomen becomes larger or swollen.  You have pressure on your bladder or trouble emptying your bladder completely.  You have pain during sexual intercourse.  You have feelings of fullness, pressure, or discomfort in your stomach.  You lose weight for no apparent reason.  You feel generally ill.  You become constipated.  You lose your appetite.  You develop acne.  You have an increase in body and facial hair.  You are gaining weight, without changing your exercise and eating habits.  You  think you are pregnant. SEEK IMMEDIATE MEDICAL CARE IF:   You have increasing abdominal pain.  You feel sick to your stomach (nauseous), and you throw up (vomit).  You develop a fever that comes on suddenly.  You have abdominal pain during a bowel movement.  Your menstrual periods become heavier than usual. MAKE SURE YOU:  Understand these instructions.  Will watch your condition.  Will get help right away if you are not doing well or get worse.   This information is not intended to replace advice given to you by your health care provider. Make sure you discuss any questions you have with your health care provider.   Document Released: 03/26/2005 Document Revised: 03/31/2013 Document Reviewed: 12/01/2012 Elsevier Interactive Patient Education [employer]2016 Elsevier Inc.

## 2015-08-17 NOTE — ED Notes (Addendum)
EMS pt vis wheelchair to the lobby, abd cramping with nausea and vomiting , last BM 5 days ago , pt tearful. 130/80, HR 62 , hysterectomy approx 6 month ago and hx of bipolar/ and tachycardia

## 2015-08-17 NOTE — ED Provider Notes (Addendum)
[facility] Emergency Department Provider Note        Time seen: ----------------------------------------- 4:03 PM on 08/17/2015 -----------------------------------------    I have reviewed the triage vital signs and the nursing notes.   HISTORY  Chief Complaint Abdominal Pain    HPI Jody Roberts is a 30 y.o. female who presents to ER for vomiting and nausea was severe abdominal cramps in the upper abdomen. Patient's concerned this may be related to a hysterectomy she had back in September. She feels like she is constipated which she deals with a lot, also has trouble urinating. Patient states it feels like her pelvic muscles are too weak to even urinate or defecate.   Past Medical History  Diagnosis Date  . Bipolar disorder (HCC)   . Abnormal uterine bleeding   . Palpitations   . IBS (irritable bowel syndrome)     There are no active problems to display for this patient.   Past Surgical History  Procedure Laterality Date  . Wisdom tooth extraction    . Laparoscopic hysterectomy Bilateral 01/06/2015    Procedure: HYSTERECTOMY TOTAL LAPAROSCOPIC/BILATERAL SALPINECTOMY;  Surgeon: Nadara Mustardobert P Harris, MD;  Location: [facility] ORS;  Service: Gynecology;  Laterality: Bilateral;  . Cystoscopy  01/06/2015    Procedure: CYSTOSCOPY;  Surgeon: Nadara Mustardobert P Harris, MD;  Location: [facility] ORS;  Service: Gynecology;;    Allergies Review of patient's allergies indicates no known allergies.  Social History Social History  Substance Use Topics  . Smoking status: Current Every Day Smoker -- 1.00 packs/day    Types: Cigarettes  . Smokeless tobacco: None  . Alcohol Use: Yes     Comment: occ    Review of Systems Constitutional: Negative for fever. Eyes: Negative for visual changes. ENT: Negative for sore throat. Cardiovascular: Negative for chest pain. Respiratory: Negative for shortness of breath. Gastrointestinal: Positive for abdominal pain and  vomiting Genitourinary: Negative for dysuria. Musculoskeletal: Negative for back pain. Skin: Negative for rash. Neurological: Negative for headaches, focal weakness or numbness.  10-point ROS otherwise negative.  ____________________________________________   PHYSICAL EXAM:  VITAL SIGNS: ED Triage Vitals  Enc Vitals Group     BP 08/17/15 1540 143/79 mmHg     Pulse Rate 08/17/15 1540 84     Resp 08/17/15 1540 18     Temp 08/17/15 1540 98 F (36.7 C)     Temp Source 08/17/15 1540 Oral     SpO2 08/17/15 1540 99 %     Weight 08/17/15 1540 120 lb (54.432 kg)     Height 08/17/15 1540 5\' 2"  (1.575 m)     Head Cir --      Peak Flow --      Pain Score 08/17/15 1540 10     Pain Loc --      Pain Edu? --      Excl. in GC? --     Constitutional: Alert and oriented. Mild to moderate distress, anxious Eyes: Conjunctivae are normal. PERRL. Normal extraocular movements. ENT   Head: Normocephalic and atraumatic.   Nose: No congestion/rhinnorhea.   Mouth/Throat: Mucous membranes are moist.   Neck: No stridor. Cardiovascular: Normal rate, regular rhythm. No murmurs, rubs, or gallops. Respiratory: Normal respiratory effort without tachypnea nor retractions. Breath sounds are clear and equal bilaterally. No wheezes/rales/rhonchi. Gastrointestinal: Nonfocal tenderness, positive bowel sounds Musculoskeletal: Nontender with normal range of motion in all extremities. No lower extremity tenderness nor edema. Neurologic:  Normal speech and language. No gross focal neurologic deficits are appreciated.  Skin:  Skin is warm, dry and intact. No rash noted. Psychiatric: Anxious mood and affect  ____________________________________________  ED COURSE:  Pertinent labs & imaging results that were available during my care of the patient were reviewed by me and considered in my medical decision making (see chart for details). Patient presents for a anxious, concerned about abdominal pain,  vomiting and nausea. She will receive morphine and Zofran. We will check basic labs and reevaluate. ____________________________________________    LABS (pertinent positives/negatives)  Labs Reviewed  COMPREHENSIVE METABOLIC PANEL - Abnormal; Notable for the following:    Glucose, Bld 126 (*)    Total Protein 8.9 (*)    Albumin 5.2 (*)    ALT 12 (*)    Total Bilirubin 1.3 (*)    All other components within normal limits  CBC - Abnormal; Notable for the following:    WBC 15.5 (*)    RBC 5.29 (*)    Hemoglobin 16.7 (*)    HCT 48.3 (*)    All other components within normal limits  URINALYSIS COMPLETEWITH MICROSCOPIC ([facility] ONLY) - Abnormal; Notable for the following:    Color, Urine AMBER (*)    APPearance CLOUDY (*)    Ketones, ur 1+ (*)    Hgb urine dipstick 2+ (*)    Protein, ur 100 (*)    Leukocytes, UA 2+ (*)    Bacteria, UA FEW (*)    Squamous Epithelial / LPF 6-30 (*)    All other components within normal limits  LIPASE, BLOOD    RADIOLOGY Images were viewed by me IMPRESSION: A 4.2 x 4.0 cm probable right ovarian cyst. Ultrasound may provide better evaluation of the pelvic structures. No other acute intra-abdominal pelvic pathology identified.  ____________________________________________  FINAL ASSESSMENT AND PLAN  Abdominal pain, Ovarian cyst, UTI  Plan: Patient with labs and imaging as dictated above. I did offer the patient ultrasound to look at the right ovary, she has declined at this time. Patient states she is very tired and would like to go home. I have advised close follow-up with her GYN doctor for reevaluation and ultrasound. She'll be discharged with pain medicine, antiemetics and antibiotics for UTI.   Emily Filbert, MD   Note: This dictation was prepared with Dragon dictation. Any transcriptional errors that result from this process are unintentional   Emily Filbert, MD 08/17/15 [id number]  Emily Filbert, MD 08/17/15  [id number]  Emily Filbert, MD 08/17/15 [phone]

## 2015-09-09 ENCOUNTER — Other Ambulatory Visit: Payer: Self-pay

## 2015-09-09 ENCOUNTER — Encounter: Payer: Self-pay | Admitting: *Deleted

## 2015-09-09 NOTE — Patient Instructions (Signed)
  Your procedure is scheduled on: 09-15-15 (THURSDAY) Report to [facility] SAME [facility] 2ND FLOOR To find out your arrival time please call [phone] between 1PM - 3PM on 09-14-15 (WEDNESDAY)  Remember: Instructions that are not followed completely may result in serious medical risk, up to and including death, or upon the discretion of your surgeon and anesthesiologist your surgery may need to be rescheduled.    _X___ 1. Do not eat food or drink liquids after midnight. No gum chewing or hard candies.     _X___ 2. No Alcohol for 24 hours before or after surgery.   ____ 3. Bring all medications with you on the day of surgery if instructed.    _X___ 4. Notify your doctor if there is any change in your medical condition     (cold, fever, infections).     Do not wear jewelry, make-up, hairpins, clips or nail polish.  Do not wear lotions, powders, or perfumes. You may wear deodorant.  Do not shave 48 hours prior to surgery. Men may shave face and neck.  Do not bring valuables to the hospital.    [facility] is not responsible for any belongings or valuables.               Contacts, dentures or bridgework may not be worn into surgery.  Leave your suitcase in the car. After surgery it may be brought to your room.  For patients admitted to the hospital, discharge time is determined by your treatment team.   Patients discharged the day of surgery will not be allowed to drive home.   Please read over the following fact sheets that you were given:      ____ Take these medicines the morning of surgery with A SIP OF WATER:    1. NONE  2.   3.   4.  5.  6.  ____ Fleet Enema (as directed)   _X___ Use CHG Soap as directed  ____ Use inhalers on the day of surgery  ____ Stop metformin 2 days prior to surgery    ____ Take 1/2 of usual insulin dose the night before surgery and none on the morning of surgery.   ____ Stop Coumadin/Plavix/aspirin-N/A  _X___ Stop  Anti-inflammatories-NO NSAIDS OR ASPIRIN PRODUCTS-TYLENOL OK TO TAKE   ____ Stop supplements until after surgery.    ____ Bring C-Pap to the hospital.  

## 2015-09-12 ENCOUNTER — Encounter
Admission: RE | Admit: 2015-09-12 | Discharge: 2015-09-12 | Disposition: A | Discharge status: Home / Self Care | Payer: Medicaid Other | Source: Ambulatory Visit | Attending: Obstetrics & Gynecology | Admitting: Obstetrics & Gynecology

## 2015-09-12 DIAGNOSIS — Z01812 Encounter for preprocedural laboratory examination: Secondary | ICD-10-CM | POA: Insufficient documentation

## 2015-09-12 LAB — BASIC METABOLIC PANEL
ANION GAP: 10 (ref 5–15)
BUN: 15 mg/dL (ref 6–20)
CALCIUM: 9.3 mg/dL (ref 8.9–10.3)
CO2: 23 mmol/L (ref 22–32)
Chloride: 103 mmol/L (ref 101–111)
Creatinine, Ser: 0.77 mg/dL (ref 0.44–1.00)
GFR calc Af Amer: >60 mL/min (ref >60)
GLUCOSE: 86 mg/dL (ref 65–99)
Potassium: 3.7 mmol/L (ref 3.5–5.1)
Sodium: 136 mmol/L (ref 135–145)

## 2015-09-12 LAB — CBC
HCT: 45.5 % (ref 35.0–47.0)
HEMOGLOBIN: 15.3 g/dL (ref 12.0–16.0)
MCH: 31.8 pg (ref 26.0–34.0)
MCHC: 33.7 g/dL (ref 32.0–36.0)
MCV: 94.4 fL (ref 80.0–100.0)
Platelets: 245 10*3/uL (ref 150–440)
RBC: 4.82 MIL/uL (ref 3.80–5.20)
RDW: 13.9 % (ref 11.5–14.5)
WBC: 8 10*3/uL (ref 3.6–11.0)

## 2015-09-12 LAB — TYPE AND SCREEN
ABO/RH(D): A POS
ANTIBODY SCREEN: NEGATIVE

## 2015-09-12 NOTE — Pre-Procedure Instructions (Signed)
CALLED DR Noralyn PickARROLL REGARDING PT TAKING 400 MG OF BENADRYL EVERY NIGHT.  DR Noralyn PickARROLL SAID THAT PT DOES NOT NEED TO TAKE ANY BENADRYL THE NIGHT BEFORE HER SURGERY AND THAT SHE CAN TAKE A MELATONIN IF NEEDED BUT THAT 400 MG OF BENADRYL COULD KILL HER.  DR CARROLL WANTED ME TO CALL HER PCP TO INFORM THEM THAT SHE IS TAKING 16 25 MG TABLETS OF BENADRYL EVERY NIGHT.  I CALLED [facility] AND SPOKE WITH NIMISHA AND INFORMED HER THAT PT IS TAKING 400 MG BENADRYL AT NIGHT AND SHE SAID THAT BACK IN AUGUST 2016 THAT PT WAS TAKING 12 TABLETS EVERY NIGHT AND THAT THEY REFERRED HER TO PSYCHIATRY TO BE EVALUATED. NIMISHA SAID PT WAS TO FOLLOW BACK UP WITH THEM IN OCT 2016 BUT NEVER DID.  NIMISHA SAID THAT Kassy Mcenroe, THEIR NP, GAVE PT A RX FOR 100 MG OF SEROQUEL TO TAKE DAILY AND THAT THEY TOLD HER BACK IN AUGUST TO STOP TAKING HER BENADRYL.  NIMISHA TALKED WITH Devarious Pavek, NP, WHILE I WAS ON THE PHONE WITH HER AND THEY ARE NOW AWARE THAT SHE IS STILL TAKING THE BENADRYL.

## 2015-09-14 ENCOUNTER — Encounter: Payer: Self-pay | Admitting: *Deleted

## 2015-09-14 NOTE — Pre-Procedure Instructions (Addendum)
CALLED PT AND INFORMED HER THAT ANESTHESIA DOES NOT WANT HER TAKING HER 400 MG OF BENADRYL TONIGHT BEFORE SURGERY. I TOLD PT MAYBE SHE COULD TRY MELATONIN TONIGHT AND SHE SAID SHE HAS TO TAKE A LOT OF THAT AND IT GIVES HER A HANGOVER.  I TOLD PT NOT TO DO THE MELATONIN THEN.  PT ASKED IF SHE COULD DRINK A COUPLE OF BEERS TONIGHT SINCE SHE CANT TAKE HER BENADRYL AND I TOLD HER NO ALCOHOL FOR 24 HOURS BEFORE OR AFTER HER SURGERY (I HAD ALREADY GONE OVER THIS WITH PT IN OFFICE). PT STATES SHE IS GLAD SHE ASKED.  PT VERBALIZED UNDERSTANDING REGARDING BEER AND BENADRYL. I ALSO CALLED DR WARDS OFFICE AND LEFT NANCY A MESSAGE REGARDING HOW MUCH BENADRYL PT TAKES AND THAT I HAVE INSTRUCTED HER NOT TO TAKE ANY BENADRYL TONIGHT BEFORE HER SURGERY PER ANESTHESIA'S REQUEST.

## 2015-09-15 ENCOUNTER — Encounter
Admission: RE | Disposition: A | Discharge status: Home / Self Care | Payer: Self-pay | Source: Ambulatory Visit | Attending: Obstetrics & Gynecology

## 2015-09-15 ENCOUNTER — Encounter: Payer: Self-pay | Admitting: *Deleted

## 2015-09-15 ENCOUNTER — Ambulatory Visit
Admission: RE | Admit: 2015-09-15 | Discharge: 2015-09-15 | Disposition: A | Discharge status: Home / Self Care | Payer: Medicaid Other | Source: Ambulatory Visit | Attending: Obstetrics & Gynecology | Admitting: Obstetrics & Gynecology

## 2015-09-15 ENCOUNTER — Ambulatory Visit: Payer: Medicaid Other | Admitting: Anesthesiology

## 2015-09-15 DIAGNOSIS — K589 Irritable bowel syndrome without diarrhea: Secondary | ICD-10-CM | POA: Insufficient documentation

## 2015-09-15 DIAGNOSIS — N8301 Follicular cyst of right ovary: Secondary | ICD-10-CM | POA: Insufficient documentation

## 2015-09-15 DIAGNOSIS — Z803 Family history of malignant neoplasm of breast: Secondary | ICD-10-CM | POA: Insufficient documentation

## 2015-09-15 DIAGNOSIS — Z90710 Acquired absence of both cervix and uterus: Secondary | ICD-10-CM | POA: Insufficient documentation

## 2015-09-15 DIAGNOSIS — F319 Bipolar disorder, unspecified: Secondary | ICD-10-CM | POA: Insufficient documentation

## 2015-09-15 DIAGNOSIS — R102 Pelvic and perineal pain: Secondary | ICD-10-CM | POA: Diagnosis present

## 2015-09-15 DIAGNOSIS — Z79899 Other long term (current) drug therapy: Secondary | ICD-10-CM | POA: Insufficient documentation

## 2015-09-15 DIAGNOSIS — F17200 Nicotine dependence, unspecified, uncomplicated: Secondary | ICD-10-CM | POA: Insufficient documentation

## 2015-09-15 DIAGNOSIS — Z809 Family history of malignant neoplasm, unspecified: Secondary | ICD-10-CM | POA: Diagnosis not present

## 2015-09-15 DIAGNOSIS — N8311 Corpus luteum cyst of right ovary: Secondary | ICD-10-CM | POA: Diagnosis not present

## 2015-09-15 HISTORY — DX: Eating disorder, unspecified: F50.9

## 2015-09-15 HISTORY — PX: LAPAROSCOPIC OVARIAN CYSTECTOMY: SHX6248

## 2015-09-15 HISTORY — PX: REPAIR VAGINAL CUFF: SHX6067

## 2015-09-15 HISTORY — DX: Hemochromatosis, unspecified: E83.119

## 2015-09-15 SURGERY — EXCISION, CYST, OVARY, LAPAROSCOPIC
Anesthesia: General | Laterality: Right | Wound class: Clean Contaminated

## 2015-09-15 MED ORDER — MIDAZOLAM HCL 2 MG/2ML IJ SOLN
INTRAMUSCULAR | Status: DC | PRN
  Administered 2015-09-15: 2 mg via INTRAVENOUS

## 2015-09-15 MED ORDER — KETOROLAC TROMETHAMINE 30 MG/ML IJ SOLN
30.0000 mg | Freq: Four times a day (QID) | INTRAMUSCULAR | Status: DC
  Administered 2015-09-15: 30 mg via INTRAVENOUS
  Filled 2015-09-15 (×5): qty 1

## 2015-09-15 MED ORDER — FENTANYL CITRATE (PF) 100 MCG/2ML IJ SOLN
INTRAMUSCULAR | Status: DC | PRN
  Administered 2015-09-15 (×4): 50 ug via INTRAVENOUS

## 2015-09-15 MED ORDER — PROPOFOL 10 MG/ML IV BOLUS
INTRAVENOUS | Status: DC | PRN
  Administered 2015-09-15: 100 mg via INTRAVENOUS
  Administered 2015-09-15: 150 mg via INTRAVENOUS

## 2015-09-15 MED ORDER — NEOSTIGMINE METHYLSULFATE 10 MG/10ML IV SOLN
INTRAVENOUS | Status: DC | PRN
  Administered 2015-09-15: 3 mg via INTRAVENOUS

## 2015-09-15 MED ORDER — IBUPROFEN 600 MG PO TABS: 600.0000 mg | ORAL_TABLET | Freq: Four times a day (QID) | ORAL | Status: DC

## 2015-09-15 MED ORDER — SUCCINYLCHOLINE CHLORIDE 20 MG/ML IJ SOLN
INTRAMUSCULAR | Status: DC | PRN
  Administered 2015-09-15: 60 mg via INTRAVENOUS

## 2015-09-15 MED ORDER — ROCURONIUM BROMIDE 100 MG/10ML IV SOLN
INTRAVENOUS | Status: DC | PRN
  Administered 2015-09-15: 20 mg via INTRAVENOUS
  Administered 2015-09-15: 10 mg via INTRAVENOUS

## 2015-09-15 MED ORDER — FENTANYL CITRATE (PF) 100 MCG/2ML IJ SOLN
25.0000 ug | INTRAMUSCULAR | Status: DC | PRN
  Administered 2015-09-15 (×5): 25 ug via INTRAVENOUS

## 2015-09-15 MED ORDER — ONDANSETRON HCL 4 MG/2ML IJ SOLN: 4.0000 mg | Freq: Once | INTRAMUSCULAR | Status: DC | PRN

## 2015-09-15 MED ORDER — ONDANSETRON HCL 4 MG/2ML IJ SOLN
INTRAMUSCULAR | Status: DC | PRN
  Administered 2015-09-15: 4 mg via INTRAVENOUS

## 2015-09-15 MED ORDER — FAMOTIDINE 20 MG PO TABS
ORAL_TABLET | ORAL | Status: AC
  Administered 2015-09-15: 20 mg via ORAL
  Filled 2015-09-15: qty 1

## 2015-09-15 MED ORDER — GLYCOPYRROLATE 0.2 MG/ML IJ SOLN
INTRAMUSCULAR | Status: DC | PRN
  Administered 2015-09-15: 0.2 mg via INTRAVENOUS
  Administered 2015-09-15: .4 mg via INTRAVENOUS

## 2015-09-15 MED ORDER — LACTATED RINGERS IV SOLN
INTRAVENOUS | Status: DC
  Administered 2015-09-15: 15:00:00 via INTRAVENOUS

## 2015-09-15 MED ORDER — CEFAZOLIN SODIUM-DEXTROSE 2-4 GM/100ML-% IV SOLN
2.0000 g | Freq: Once | INTRAVENOUS | Status: AC
  Administered 2015-09-15: 2 g via INTRAVENOUS

## 2015-09-15 MED ORDER — HEPARIN SODIUM (PORCINE) 5000 UNIT/ML IJ SOLN
5000.0000 [IU] | Freq: Once | INTRAMUSCULAR | Status: AC
  Administered 2015-09-15: 5000 [IU] via SUBCUTANEOUS

## 2015-09-15 MED ORDER — BUPIVACAINE HCL 0.5 % IJ SOLN
INTRAMUSCULAR | Status: DC | PRN
  Administered 2015-09-15: 10 mL

## 2015-09-15 MED ORDER — BUPIVACAINE HCL (PF) 0.5 % IJ SOLN
INTRAMUSCULAR | Status: AC
  Filled 2015-09-15: qty 30

## 2015-09-15 MED ORDER — OXYCODONE HCL 5 MG PO TABA: 5.0000 mg | ORAL_TABLET | ORAL | Status: DC | PRN

## 2015-09-15 MED ORDER — CEFAZOLIN SODIUM-DEXTROSE 2-4 GM/100ML-% IV SOLN
INTRAVENOUS | Status: AC
  Administered 2015-09-15: 2 g via INTRAVENOUS
  Filled 2015-09-15: qty 100

## 2015-09-15 MED ORDER — HEPARIN SODIUM (PORCINE) 5000 UNIT/ML IJ SOLN
INTRAMUSCULAR | Status: AC
  Administered 2015-09-15: 5000 [IU] via SUBCUTANEOUS
  Filled 2015-09-15: qty 1

## 2015-09-15 MED ORDER — FENTANYL CITRATE (PF) 100 MCG/2ML IJ SOLN
INTRAMUSCULAR | Status: AC
  Filled 2015-09-15: qty 2

## 2015-09-15 MED ORDER — KETOROLAC TROMETHAMINE 30 MG/ML IJ SOLN
INTRAMUSCULAR | Status: AC
  Administered 2015-09-15: 30 mg
  Filled 2015-09-15: qty 1

## 2015-09-15 MED ORDER — LIDOCAINE HCL (CARDIAC) 20 MG/ML IV SOLN
INTRAVENOUS | Status: DC | PRN
  Administered 2015-09-15 (×2): 60 mg via INTRAVENOUS

## 2015-09-15 MED ORDER — DEXAMETHASONE SODIUM PHOSPHATE 10 MG/ML IJ SOLN
INTRAMUSCULAR | Status: DC | PRN
  Administered 2015-09-15: 10 mg via INTRAVENOUS

## 2015-09-15 MED ORDER — FAMOTIDINE 20 MG PO TABS
20.0000 mg | ORAL_TABLET | Freq: Once | ORAL | Status: AC
  Administered 2015-09-15: 20 mg via ORAL

## 2015-09-15 SURGICAL SUPPLY — 68 items
BAG URO DRAIN 2000ML W/SPOUT (MISCELLANEOUS) ×4 IMPLANT
BLADE SURG SZ10 CARB STEEL (BLADE) ×4 IMPLANT
BLADE SURG SZ11 CARB STEEL (BLADE) ×4 IMPLANT
CANISTER SUCT 1200ML W/VALVE (MISCELLANEOUS) ×4 IMPLANT
CATH FOLEY 2WAY  5CC 16FR (CATHETERS) ×2
CATH FOLEY 2WAY 5CC 16FR (CATHETERS) ×2
CATH ROBINSON RED A/P 16FR (CATHETERS) ×2 IMPLANT
CATH URTH 16FR FL 2W BLN LF (CATHETERS) ×2 IMPLANT
CHLORAPREP W/TINT 26ML (MISCELLANEOUS) ×4 IMPLANT
DRAPE LEGGINS SURG 28X43 STRL (DRAPES) ×4 IMPLANT
DRAPE PERI LITHO V/GYN (MISCELLANEOUS) ×4 IMPLANT
DRAPE SHEET LG 3/4 BI-LAMINATE (DRAPES) ×4 IMPLANT
DRAPE SURG 17X11 SM STRL (DRAPES) ×4 IMPLANT
DRAPE UNDER BUTTOCK W/FLU (DRAPES) ×2 IMPLANT
DRESSING TELFA 4X3 1S ST N-ADH (GAUZE/BANDAGES/DRESSINGS) ×6 IMPLANT
DRSG TEGADERM 2-3/8X2-3/4 SM (GAUZE/BANDAGES/DRESSINGS) ×4 IMPLANT
ELECT REM PT RETURN 9FT ADLT (ELECTROSURGICAL) ×4
ELECTRODE REM PT RTRN 9FT ADLT (ELECTROSURGICAL) ×2 IMPLANT
ENDOPOUCH RETRIEVER 10 (MISCELLANEOUS) ×2 IMPLANT
GAUZE SPONGE NON-WVN 2X2 STRL (MISCELLANEOUS) ×4 IMPLANT
GLOVE BIOGEL PI IND STRL 6.5 (GLOVE) ×2 IMPLANT
GLOVE BIOGEL PI INDICATOR 6.5 (GLOVE) ×2
GLOVE SURG SYN 6.5 ES PF (GLOVE) ×4 IMPLANT
GLOVE SURG SYN 6.5 PF PI (GLOVE) ×2 IMPLANT
GOWN STRL REUS W/ TWL LRG LVL3 (GOWN DISPOSABLE) ×4 IMPLANT
GOWN STRL REUS W/ TWL XL LVL3 (GOWN DISPOSABLE) ×2 IMPLANT
GOWN STRL REUS W/TWL LRG LVL3 (GOWN DISPOSABLE) ×8
GOWN STRL REUS W/TWL XL LVL3 (GOWN DISPOSABLE) ×4
GRASPER SUT TROCAR 14GX15 (MISCELLANEOUS) ×2 IMPLANT
IRRIGATION STRYKERFLOW (MISCELLANEOUS) IMPLANT
IRRIGATOR STRYKERFLOW (MISCELLANEOUS) ×4
IV LACTATED RINGERS 1000ML (IV SOLUTION) ×4 IMPLANT
IV NS 1000ML (IV SOLUTION) ×4
IV NS 1000ML BAXH (IV SOLUTION) ×2 IMPLANT
KIT RM TURNOVER CYSTO AR (KITS) ×4 IMPLANT
LABEL OR SOLS (LABEL) IMPLANT
LIGASURE MARYLAND LAP STAND (ELECTROSURGICAL) ×2 IMPLANT
LIQUID BAND (GAUZE/BANDAGES/DRESSINGS) ×2 IMPLANT
MANIPULATOR UTERINE 4.5 ZUMI (MISCELLANEOUS) ×2 IMPLANT
NDL SAFETY 18GX1.5 (NEEDLE) ×4 IMPLANT
NDL SAFETY 22GX1.5 (NEEDLE) ×4 IMPLANT
NEEDLE VERESS 14GA 120MM (NEEDLE) ×2 IMPLANT
NS IRRIG 500ML POUR BTL (IV SOLUTION) ×4 IMPLANT
PACK BASIN MINOR ARMC (MISCELLANEOUS) ×4 IMPLANT
PACK LAP CHOLECYSTECTOMY (MISCELLANEOUS) ×4 IMPLANT
PAD OB MATERNITY 4.3X12.25 (PERSONAL CARE ITEMS) ×4 IMPLANT
PAD PREP 24X41 OB/GYN DISP (PERSONAL CARE ITEMS) ×4 IMPLANT
PAD TRENDELENBURG OR TABLE (MISCELLANEOUS) ×4 IMPLANT
PENCIL ELECTRO HAND CTR (MISCELLANEOUS) ×2 IMPLANT
SCISSORS METZENBAUM CVD 33 (INSTRUMENTS) ×2 IMPLANT
SHEARS HARMONIC ACE PLUS 36CM (ENDOMECHANICALS) ×2 IMPLANT
SLEEVE ENDOPATH XCEL 5M (ENDOMECHANICALS) ×4 IMPLANT
SPONGE VERSALON 2X2 STRL (MISCELLANEOUS)
SPONGE XRAY 4X4 16PLY STRL (MISCELLANEOUS) ×2 IMPLANT
SUT ETHIBOND NAB CT1 #1 30IN (SUTURE) ×2 IMPLANT
SUT MNCRL AB 4-0 PS2 18 (SUTURE) ×2 IMPLANT
SUT VIC AB 0 CT1 27 (SUTURE) ×4
SUT VIC AB 0 CT1 27XCR 8 STRN (SUTURE) ×2 IMPLANT
SUT VIC AB 0 CT1 36 (SUTURE) ×2 IMPLANT
SUT VIC AB 1 CT1 36 (SUTURE) ×2 IMPLANT
SUT VIC AB 2-0 CT1 (SUTURE) ×2 IMPLANT
SUT VIC AB 2-0 UR6 27 (SUTURE) ×2 IMPLANT
SUT VIC AB 4-0 PS2 18 (SUTURE) ×2 IMPLANT
SYR CONTROL 10ML (SYRINGE) ×2 IMPLANT
SYRINGE 10CC LL (SYRINGE) ×2 IMPLANT
TROCAR ENDO BLADELESS 11MM (ENDOMECHANICALS) ×2 IMPLANT
TROCAR XCEL NON-BLD 5MMX100MML (ENDOMECHANICALS) ×2 IMPLANT
TUBING INSUFFLATOR HI FLOW (MISCELLANEOUS) ×4 IMPLANT

## 2015-09-15 NOTE — Discharge Instructions (Signed)
Discharge instructions:  Call office if you have any of the following: fever >101 F, chills, excessive vaginal bleeding, incision drainage or problems, leg pain or redness, or any other concerns.   Activity: Do not lift > 10 lbs for 8 weeks.  No intercourse or tampons for 8 weeks -- not until cleared by Dr. Elesa Massed No driving until you are certain you can slam on the brakes.   Please don't limit yourself in terms of routine activity.  You will be able to do most things, although they may take longer to do or be a little painful.  You can do it! You may have some right upper abdominal/right rib and right shoulder pain.  This is due to the gas used to inflate your belly.  Only time will make this better, please be patient.  Don't be a hero, but don't be a wimp either!         General Anesthesia, Adult General anesthesia is a sleep-like state of non-feeling produced by medicines (anesthetics). General anesthesia prevents you from being alert and feeling pain during a medical procedure. Your caregiver may recommend general anesthesia if your procedure:  Is long.  Is painful or uncomfortable.  Would be frightening to see or hear.  Requires you to be still.  Affects your breathing.  Causes significant blood loss. LET YOUR CAREGIVER KNOW ABOUT:  Allergies to food or medicine.  Medicines taken, including vitamins, herbs, eyedrops, over-the-counter medicines, and creams.  Use of steroids (by mouth or creams).  Previous problems with anesthetics or numbing medicines, including problems experienced by relatives.  History of bleeding problems or blood clots.  Previous surgeries and types of anesthetics received.  Possibility of pregnancy, if this applies.  Use of cigarettes, alcohol, or illegal drugs.  Any health condition(s), especially diabetes, sleep apnea, and high blood pressure. RISKS AND COMPLICATIONS General anesthesia rarely causes complications. However, if  complications do occur, they can be life threatening. Complications include:  A lung infection.  A stroke.  A heart attack.  Waking up during the procedure. When this occurs, the patient may be unable to move and communicate that he or she is awake. The patient may feel severe pain. Older adults and adults with serious medical problems are more likely to have complications than adults who are young and healthy. Some complications can be prevented by answering all of your caregiver's questions thoroughly and by following all pre-procedure instructions. It is important to tell your caregiver if any of the pre-procedure instructions, especially those related to diet, were not followed. Any food or liquid in the stomach can cause problems when you are under general anesthesia. BEFORE THE PROCEDURE  Ask your caregiver if you will have to spend the night at the hospital. If you will not have to spend the night, arrange to have an adult drive you and stay with you for 24 hours.  Follow your caregiver's instructions if you are taking dietary supplements or medicines. Your caregiver may tell you to stop taking them or to reduce your dosage.  Do not smoke for as long as possible before your procedure. If possible, stop smoking 3-6 weeks before the procedure.  Do not take new dietary supplements or medicines within 1 week of your procedure unless your caregiver approves them.  Do not eat within 8 hours of your procedure or as directed by your caregiver. Drink only clear liquids, such as water, black coffee (without milk or cream), and fruit juices (without pulp).  Do not  drink within 3 hours of your procedure or as directed by your caregiver.  You may brush your teeth on the morning of the procedure, but make sure to spit out the toothpaste and water when finished. PROCEDURE  You will receive anesthetics through a mask, through an intravenous (IV) access tube, or through both. A doctor who specializes  in anesthesia (anesthesiologist) or a nurse who specializes in anesthesia (nurse anesthetist) or both will stay with you throughout the procedure to make sure you remain unconscious. He or she will also watch your blood pressure, pulse, and oxygen levels to make sure that the anesthetics do not cause any problems. Once you are asleep, a breathing tube or mask may be used to help you breathe. AFTER THE PROCEDURE You will wake up after the procedure is complete. You may be in the room where the procedure was performed or in a recovery area. You may have a sore throat if a breathing tube was used. You may also feel:  Dizzy.  Weak.  Drowsy.  Confused.  Nauseous.  Cold. These are all normal responses and can be expected to last for up to 24 hours after the procedure is complete. A caregiver will tell you when you are ready to go home. This will usually be when you are fully awake and in stable condition.   This information is not intended to replace advice given to you by your health care provider. Make sure you discuss any questions you have with your health care provider.   Document Released: 07/03/2007 Document Revised: 04/16/2014 Document Reviewed: 07/25/2011 Elsevier Interactive Patient Education [employer]2016 [employer]

## 2015-09-15 NOTE — Anesthesia Procedure Notes (Signed)
Procedure Name: Intubation Date/Time: 09/15/2015 3:03 PM Performed by: Michaele OfferSAVAGE, Heavenlee Maiorana Pre-anesthesia Checklist: Patient identified, Emergency Drugs available, Suction available, Patient being monitored and Timeout performed Patient Re-evaluated:Patient Re-evaluated prior to inductionOxygen Delivery Method: Circle system utilized Preoxygenation: Pre-oxygenation with 100% oxygen Intubation Type: IV induction Ventilation: Mask ventilation without difficulty Laryngoscope Size: Mac and 3 Grade View: Grade I Tube type: Oral Tube size: 7.0 mm Number of attempts: 1 Airway Equipment and Method: Rigid stylet Placement Confirmation: ETT inserted through vocal cords under direct vision,  positive ETCO2 and breath sounds checked- equal and bilateral Secured at: 19 cm Tube secured with: Tape Dental Injury: Teeth and Oropharynx as per pre-operative assessment

## 2015-09-15 NOTE — Anesthesia Preprocedure Evaluation (Signed)
Anesthesia Evaluation  Patient identified by MRN, date of birth, ID band Patient awake    Reviewed: Allergy & Precautions, H&P , NPO status , Patient's Chart, lab work & pertinent test results  History of Anesthesia Complications Negative for: history of anesthetic complications  Airway Mallampati: I  TM Distance: >3 FB Neck ROM: full    Dental  (+) Poor Dentition   Pulmonary neg shortness of breath, Current Smoker,    Pulmonary exam normal breath sounds clear to auscultation       Cardiovascular Exercise Tolerance: Good (-) angina(-) Past MI and (-) DOE negative cardio ROS Normal cardiovascular exam Rhythm:regular Rate:Normal     Neuro/Psych PSYCHIATRIC DISORDERS Bipolar Disorder negative neurological ROS     GI/Hepatic negative GI ROS, Neg liver ROS, neg GERD  ,  Endo/Other  negative endocrine ROS  Renal/GU negative Renal ROS  negative genitourinary   Musculoskeletal   Abdominal   Peds  Hematology negative hematology ROS (+)   Anesthesia Other Findings Past Medical History:   Bipolar disorder (HCC)                                       Abnormal uterine bleeding                                    Palpitations                                                   Comment:INTERMITTENT-PT DENIES SYMPTOMS OF SOB,               DIZZINES ETC   IBS (irritable bowel syndrome)                               Hemochromatosis                                              Eating disorder                                             Past Surgical History:   WISDOM TOOTH EXTRACTION                                       LAPAROSCOPIC HYSTERECTOMY                       Bilateral 01/06/2015      Comment:Procedure: HYSTERECTOMY TOTAL               LAPAROSCOPIC/BILATERAL SALPINECTOMY;  Surgeon:               Nadara Mustard, MD;  Location: [facility];                Service: Gynecology;  Laterality: Bilateral;   CYSTOSCOPY  01/06/2015      Comment:Procedure: CYSTOSCOPY;  Surgeon: Nadara Mustardobert P               Harris, MD;  Location: [facility];  Service:               Gynecology;;  BMI    Body Mass Index   21.21 kg/m 2      Reproductive/Obstetrics negative OB ROS                             Anesthesia Physical Anesthesia Plan  ASA: III  Anesthesia Plan: General ETT   Post-op Pain Management:    Induction:   Airway Management Planned:   Additional Equipment:   Intra-op Plan:   Post-operative Plan:   Informed Consent: I have reviewed the patients History and Physical, chart, labs and discussed the procedure including the risks, benefits and alternatives for the proposed anesthesia with the patient or authorized representative who has indicated his/her understanding and acceptance.   Dental Advisory Given  Plan Discussed with: Anesthesiologist, CRNA and Surgeon  Anesthesia Plan Comments:         Anesthesia Quick Evaluation

## 2015-09-15 NOTE — H&P (Signed)
H&P Update  PLEASE SEE PAPER H&P  Pt was last seen in my office, and complete history and physical performed.  The surgical history has been reviewed and remains accurate without interval change. The patient was re-examined and patient's physiologic condition has not changed significantly in the last 30 days.  No new pharmacological allergies or types of therapy has been initiated.  No Known Allergies  Past Medical History  Diagnosis Date  . Bipolar disorder (HCC)   . Abnormal uterine bleeding   . Palpitations     INTERMITTENT-PT DENIES SYMPTOMS OF SOB, DIZZINES ETC  . IBS (irritable bowel syndrome)   . Hemochromatosis   . Eating disorder    Past Surgical History  Procedure Laterality Date  . Wisdom tooth extraction    . Laparoscopic hysterectomy Bilateral 01/06/2015    Procedure: HYSTERECTOMY TOTAL LAPAROSCOPIC/BILATERAL SALPINECTOMY;  Surgeon: Nadara Mustardobert P Harris, MD;  Location: [facility] OR[facility];  Service: Gynecology;  Laterality: Bilateral;  . Cystoscopy  01/06/2015    Procedure: CYSTOSCOPY;  Surgeon: Nadara Mustardobert P Harris, MD;  Location: [facility] OR[facility];  Service: Gynecology;;    BP 134/82 mmHg  Pulse 68  Temp(Src) 98.6 F (37 C) (Oral)  Resp 18  Ht 5\' 2"  (1.575 m)  Wt 52.617 kg (116 lb)  BMI 21.21 kg/m2  SpO2 100%  LMP 12/20/2014  NAD RRR no murmurs CTAB, no wheezing, resps unlabored +BS, soft, NTTP No c/c/e Pelvic exam deferred  The above history was confirmed with the patient. The condition still exists that makes this procedure necessary. Surgical plan includes Operative Laparoscopy, Ovarian Cystectomy, and possible vaginal cuff repair, as confirmed on the consent. The treatment plan remains the same, without new options for care.  The patient understands the potential benefits and risks and the consents have been signed and placed on the chart.     Ranae Plumberhelsea Rafay Dahan, MD Attending Obstetrician Gynecologist [facility]

## 2015-09-15 NOTE — Transfer of Care (Signed)
Immediate Anesthesia Transfer of Care Note  Patient: Jody Roberts  Procedure(s) Performed: Procedure(s): LAPAROSCOPIC RIGHT OOPHERECTOMY  (Right) REPAIR VAGINAL CUFF (N/A)  Patient Location: PACU  Anesthesia Type:General  Level of Consciousness: awake, alert , oriented and patient cooperative  Airway & Oxygen Therapy: Patient Spontanous Breathing and Patient connected to face mask oxygen  Post-op Assessment: Report given to RN, Post -op Vital signs reviewed and stable and Patient moving all extremities X 4  Post vital signs: Reviewed and stable  Last Vitals:  Filed Vitals:   09/15/15 1335 09/15/15 1648  BP: 134/82 109/66  Pulse: 68   Temp: 37 C 36.4 C  Resp: 18     Last Pain:  Filed Vitals:   09/15/15 1649  PainSc: 8          Complications: No apparent anesthesia complications

## 2015-09-15 NOTE — Op Note (Signed)
Jody Roberts PROCEDURE DATE: 09/15/2015  PATIENT:  Jody Roberts  30 y.o. female  PRE-OPERATIVE DIAGNOSIS:  PELVIC PAIN,RIGHT OVARIAN CYST  POST-OPERATIVE DIAGNOSIS:  PELVIC PAIN,RIGHT OVARIAN CYST  PROCEDURE:  Procedure(s): LAPAROSCOPIC RIGHT OOPHERECTOMY  (Right) REPAIR VAGINAL CUFF (N/A)  SURGEON:  Surgeon(s) and Role:    * Chelsea C Ward, MD - Primary  ANESTHESIA:  General via ET  I/O   950 crystalloid, 100cc urine out, 15cc blood  FINDINGS:  Normal upper abdomen. Redundant and grossly dilated loops of bowel and colon.  Normal appearing appendix.  Normal appearing left and right ovaries, right ovary with small hemorrhagic cyst.  Pinpoint opening in thin mucosal tissue of apex of vagina.  Right ureter vermiculating pre and post procedure  SPECIMEN: Right ovary  COMPLICATIONS: none apparent  DISPOSITION: vital signs stable to PACU   Indication for Surgery: 30 y.o. s/p hysterectomy with complaints of substantial pain after intercourse and chronic right sided pelvic pain.  Found to have a small cyst on right ovary, and spot on vaginal cuff concerning for inadequate healing.  Risks of surgery were discussed with the patient including but not limited to: bleeding which may require transfusion or reoperation; infection which may require antibiotics; injury to bowel, bladder, ureters or other surrounding organs; need for additional procedures including laparotomy, blood clot, incisional problems and other postoperative/anesthesia complications. Written informed consent was obtained.       PROCEDURE IN DETAIL:  The patient had sequential compression devices applied to her lower extremities while in the preoperative area.  She was then taken to the operating room where general anesthesia was administered via ET.  She was placed in the dorsal lithotomy position, and was prepped and draped in a sterile manner.  After a surgical timeout was performed, attention was turned to the abdomen  where an umbilical incision was made with the scalpel.  A 5mm trochar was inserted in the umbilical incision using a visiport method. A pneumoperitoneum was created and survey of the patient's pelvis and abdomen revealed the findings as mentioned above. Two 5mm ports were inserted in the lower left and right quadrants under visualization.    She was placed in steep trendelenburg, and it was noted the largely dilated and redundant bowel.  Small bowel was dilated as was colon.  Sigmoid was double its normal length, and dilated to what appeared to be 8-10cm in parts.  Bilateral ovaries appeared fairly normal, no scarring.  The right ovary was tented up from its pedicle and a hemorrhagic cyst was noted at its apex.  The peritoneum surrounding the IP was ligated and the ureter was visualized retroperitoneally.  The IP was then doubly ligated and transected with the Ligasure.  This was placed in the patient's pelvis.  There were no bowel adhesions to the vaginal cuff inside the pelvis.  With the laparoscope in place, the attention was turned to the vagina for examination.  There was a spot of blood at the vaginal cuff and the tissue was transilluminated as such that it was notibly thin.  There was gas seeping through the cuff at the site of blood.  This was touched with the smooth pickups and immediately the peritoneal cavity was entered.  The tissue was spread and the orifice was closed with horizontal mattress sutures.  This was tested for integrity using a surgeon's finger.  The pneumoperitoneum was recreated.  The operative site was surveyed, and it was found to be hemostatic.  No intraoperative injury to surrounding  organs was noted.  The abdomen was desufflated and all instruments were then removed from the patient's abdomen.  All skin incisions were closed with 4-0 monocryl and covered with surgical glue. The patient tolerated the procedures well.  All instruments, needles, and sponge counts were correct x 2.  The patient was taken to the recovery room in stable condition.   ---- Ranae Plumber, MD Attending Obstetrician and Gynecologist [facility]

## 2015-09-16 ENCOUNTER — Encounter: Payer: Self-pay | Admitting: Obstetrics & Gynecology

## 2015-09-16 NOTE — Anesthesia Postprocedure Evaluation (Signed)
Anesthesia Post Note  Patient: Jody Roberts  Procedure(s) Performed: Procedure(s) (LRB): LAPAROSCOPIC RIGHT OOPHERECTOMY  (Right) REPAIR VAGINAL CUFF (N/A)  Patient location during evaluation: PACU Anesthesia Type: General Level of consciousness: awake and alert Pain management: pain level controlled Vital Signs Assessment: post-procedure vital signs reviewed and stable Respiratory status: spontaneous breathing, nonlabored ventilation, respiratory function stable and patient connected to nasal cannula oxygen Cardiovascular status: blood pressure returned to baseline and stable Postop Assessment: no signs of nausea or vomiting Anesthetic complications: no    Last Vitals:  Filed Vitals:   09/15/15 1802 09/15/15 1811  BP: 131/70 119/65  Pulse:  84  Temp: 36.9 C   Resp: 16 16    Last Pain:  Filed Vitals:   09/16/15 0850  PainSc: 0-No pain                 Cleda MccreedyJoseph K Piscitello

## 2015-09-19 LAB — SURGICAL PATHOLOGY

## 2015-12-07 ENCOUNTER — Telehealth (HOSPITAL_COMMUNITY): Payer: Self-pay

## 2015-12-07 ENCOUNTER — Encounter (HOSPITAL_COMMUNITY): Payer: Self-pay

## 2015-12-07 NOTE — Telephone Encounter (Signed)
[facility]  Called pt on 12/07/15 LMOM to call back and schedule the appt.

## 2015-12-20 DIAGNOSIS — F32A Depression, unspecified: Secondary | ICD-10-CM | POA: Insufficient documentation

## 2015-12-20 DIAGNOSIS — M2550 Pain in unspecified joint: Secondary | ICD-10-CM

## 2015-12-20 DIAGNOSIS — F329 Major depressive disorder, single episode, unspecified: Secondary | ICD-10-CM | POA: Insufficient documentation

## 2015-12-20 DIAGNOSIS — F319 Bipolar disorder, unspecified: Secondary | ICD-10-CM | POA: Insufficient documentation

## 2015-12-20 DIAGNOSIS — G8929 Other chronic pain: Secondary | ICD-10-CM | POA: Insufficient documentation

## 2015-12-30 ENCOUNTER — Encounter: Payer: Self-pay | Admitting: Hematology and Oncology

## 2015-12-30 ENCOUNTER — Inpatient Hospital Stay: Payer: Medicaid Other | Attending: Hematology and Oncology | Admitting: Hematology and Oncology

## 2015-12-30 ENCOUNTER — Inpatient Hospital Stay: Payer: Medicaid Other

## 2015-12-30 DIAGNOSIS — R002 Palpitations: Secondary | ICD-10-CM | POA: Diagnosis not present

## 2015-12-30 DIAGNOSIS — R634 Abnormal weight loss: Secondary | ICD-10-CM | POA: Diagnosis not present

## 2015-12-30 DIAGNOSIS — Z79899 Other long term (current) drug therapy: Secondary | ICD-10-CM

## 2015-12-30 DIAGNOSIS — F17210 Nicotine dependence, cigarettes, uncomplicated: Secondary | ICD-10-CM

## 2015-12-30 DIAGNOSIS — Z90722 Acquired absence of ovaries, bilateral: Secondary | ICD-10-CM

## 2015-12-30 DIAGNOSIS — R5383 Other fatigue: Secondary | ICD-10-CM

## 2015-12-30 DIAGNOSIS — L659 Nonscarring hair loss, unspecified: Secondary | ICD-10-CM | POA: Diagnosis not present

## 2015-12-30 DIAGNOSIS — F319 Bipolar disorder, unspecified: Secondary | ICD-10-CM

## 2015-12-30 DIAGNOSIS — F509 Eating disorder, unspecified: Secondary | ICD-10-CM | POA: Diagnosis not present

## 2015-12-30 DIAGNOSIS — Z8349 Family history of other endocrine, nutritional and metabolic diseases: Secondary | ICD-10-CM

## 2015-12-30 DIAGNOSIS — G4700 Insomnia, unspecified: Secondary | ICD-10-CM | POA: Diagnosis not present

## 2015-12-30 DIAGNOSIS — M129 Arthropathy, unspecified: Secondary | ICD-10-CM

## 2015-12-30 DIAGNOSIS — K589 Irritable bowel syndrome without diarrhea: Secondary | ICD-10-CM | POA: Diagnosis not present

## 2015-12-30 DIAGNOSIS — R1110 Vomiting, unspecified: Secondary | ICD-10-CM | POA: Diagnosis not present

## 2015-12-30 LAB — CBC WITH DIFFERENTIAL/PLATELET
Basophils Absolute: 0 10*3/uL (ref 0–0.1)
Basophils Relative: 0 %
Eosinophils Absolute: 0.6 10*3/uL (ref 0–0.7)
Eosinophils Relative: 7 %
HCT: 42.9 % (ref 35.0–47.0)
Hemoglobin: 15 g/dL (ref 12.0–16.0)
Lymphocytes Relative: 39 %
Lymphs Abs: 3.2 10*3/uL (ref 1.0–3.6)
MCH: 32.9 pg (ref 26.0–34.0)
MCHC: 34.9 g/dL (ref 32.0–36.0)
MCV: 94.3 fL (ref 80.0–100.0)
Monocytes Absolute: 0.6 10*3/uL (ref 0.2–0.9)
Monocytes Relative: 8 %
Neutro Abs: 3.7 10*3/uL (ref 1.4–6.5)
Neutrophils Relative %: 46 %
Platelets: 271 10*3/uL (ref 150–440)
RBC: 4.56 MIL/uL (ref 3.80–5.20)
RDW: 13.7 % (ref 11.5–14.5)
WBC: 8.1 10*3/uL (ref 3.6–11.0)

## 2015-12-30 LAB — IRON AND TIBC
Iron: 68 ug/dL (ref 28–170)
Saturation Ratios: 17 % (ref 10.4–31.8)
TIBC: 402 ug/dL (ref 250–450)
UIBC: 334 ug/dL

## 2015-12-30 LAB — HEPATIC FUNCTION PANEL
ALT: 17 U/L (ref 14–54)
AST: 22 U/L (ref 15–41)
Albumin: 4.4 g/dL (ref 3.5–5.0)
Alkaline Phosphatase: 42 U/L (ref 38–126)
Bilirubin, Direct: <0.1 mg/dL — ABNORMAL LOW (ref 0.1–0.5)
Total Bilirubin: 0.7 mg/dL (ref 0.3–1.2)
Total Protein: 7.4 g/dL (ref 6.5–8.1)

## 2015-12-30 LAB — FERRITIN: Ferritin: 41 ng/mL (ref 11–307)

## 2015-12-30 NOTE — Progress Notes (Signed)
Patient has multiple medical problems. She has RA which causes her constant pain. She has chronic constipation.

## 2015-12-30 NOTE — Progress Notes (Signed)
[facility]-  Cancer Center  Clinic day:  12/30/2015  Chief Complaint: Jody Roberts is a 30 y.o. female with a family history of hemochromatosis who is referred in consultation by Dr. Welton Flakes.  HPI: The patient notes a family history of hemochromatosis.  She has an identical twin sister with hemochromatosis who lives in [place].  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  She has symptoms including fatigue, arthritis, and hair loss. She undergoes regular phlebotomy.  The patient's father and brother are unaffected carriers.  The patient is states that she underwent hysterectomy on 01/06/2015.  Prior to surgery,  she "bled straight for 10 years".  Iron studies in the past were normal, but performed prior to her hysterectomy.  Labs on 10/28/2014 included a hematocrit of 44.4, hemoglobin 15.0, MCV 94, platelets 266,000, white count 7500.  Liver function tests were normal.  Rheumatoid factor was 8.6 (normal).  Ferritin was 104.  Sedimentation rate was 2. Iron saturation was 29%   Labs on 08/17/2015 included a hematocrit of 48.3, hemoglobin 16.7 MCV 91.3, platelets 345,000, and white count 13,500.   Symptomatically, she has arthritis and is seeing rheumatology.  She has lost 10 pounds in the past month.  She eats and has emesis.  She is on Seroquel and Klonopin.  She has trouble sleeping at night and takes 400 mg of Benadryl.   Past Medical History:  Diagnosis Date  . Abnormal uterine bleeding   . Bipolar disorder (HCC)   . Eating disorder   . Hemochromatosis   . IBS (irritable bowel syndrome)   . Palpitations    INTERMITTENT-PT DENIES SYMPTOMS OF SOB, DIZZINES ETC    Past Surgical History:  Procedure Laterality Date  . CYSTOSCOPY  01/06/2015   Procedure: CYSTOSCOPY;  Surgeon: Nadara Mustard, MD;  Location: [facility] ORS;  Service: Gynecology;;  . LAPAROSCOPIC HYSTERECTOMY Bilateral 01/06/2015    Procedure: HYSTERECTOMY TOTAL LAPAROSCOPIC/BILATERAL SALPINECTOMY;  Surgeon: Nadara Mustard, MD;  Location: [facility] ORS;  Service: Gynecology;  Laterality: Bilateral;  . LAPAROSCOPIC OVARIAN CYSTECTOMY Right 09/15/2015   Procedure: LAPAROSCOPIC RIGHT OOPHERECTOMY ;  Surgeon: Elenora Fender Ward, MD;  Location: [facility] ORS;  Service: Gynecology;  Laterality: Right;  . REPAIR VAGINAL CUFF N/A 09/15/2015   Procedure: REPAIR VAGINAL CUFF;  Surgeon: Elenora Fender Ward, MD;  Location: [facility] ORS;  Service: Gynecology;  Laterality: N/A;  . WISDOM TOOTH EXTRACTION      History reviewed. No pertinent family history.  Social History:  reports that she has been smoking Cigarettes.  She has a 13.00 pack-year smoking history. She has never used smokeless tobacco. She reports that she drinks alcohol. She reports that she does not use drugs.  She has smoked 1 pack a day for 10-12 years.  Her mother is a Scientist, product/process development.  She works as a Mudlogger at [employer]. 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  Allergies: No Known Allergies  Current Medications: Current Outpatient Prescriptions  Medication Sig Dispense Refill  . clonazePAM (KLONOPIN) 0.5 MG tablet Take 0.5 mg by mouth at bedtime.    . meloxicam (MOBIC) 7.5 MG tablet Take 7.5 mg by mouth daily.    . QUEtiapine (SEROQUEL) 100 MG tablet Take 100 mg by mouth at bedtime.    Marland Kitchen acyclovir (ZOVIRAX) 200 MG capsule Take by mouth.    Marland Kitchen ibuprofen (ADVIL,MOTRIN) 600 MG tablet Take 1 tablet (600 mg total) by mouth every 6 (six)  hours. (Patient not taking: Reported on 12/30/2015) 31 tablet 0   No current facility-administered medications for this visit.     Review of Systems:  GENERAL:  Feels "ok".  No fevers or sweats.  Weight loss of 15 pounds in the past month. PERFORMANCE STATUS (ECOG):  1 HEENT:  No visual changes, runny nose, sore throat, mouth sores or tenderness. Lungs: No shortness of breath or cough.  No hemoptysis. Cardiac:  Palpitations at night.  No chest pain, orthopnea, or  PND. GI:  Constipation x 15 years.  Emesis.  No nausea, diarrhea,melena or hematochezia. GU:  No urgency, frequency, dysuria, or hematuria. Musculoskeletal:  No back pain.  Arthritis.  No muscle tenderness. Extremities:  No pain or swelling. Skin:  No rashes or skin changes. Neuro:  No headache, numbness or weakness, balance or coordination issues. Endocrine:  No diabetes, thyroid issues, hot flashes or night sweats. Psych:  Bipolar disorder.  Poor sleep.  No mood changes, depression or anxiety. Pain:  No focal pain. Review of systems:  All other systems reviewed and found to be negative.  Physical Exam: Blood pressure (!) 142/93, pulse 95, temperature 98.9 F (37.2 C), temperature source Oral, height '5\' 2"'$  (1.575 m), weight 114 lb 13.8 oz (52.1 kg), last menstrual period 12/20/2014. GENERAL:  Well developed, well nourished, woman sitting comfortably in the exam room in no acute distress. MENTAL STATUS:  Alert and oriented to person, place and time. HEAD:  Auburn hair pulled up.  Normocephalic, atraumatic, face symmetric, no Cushingoid features. EYES: Green eyes.  Pupils equal round and reactive to light and accomodation.  No conjunctivitis or scleral icterus. ENT:  Oropharynx clear without lesion.  Tongue normal. Mucous membranes moist.  RESPIRATORY:  Clear to auscultation without rales, wheezes or rhonchi. CARDIOVASCULAR:  Regular rate and rhythm without murmur, rub or gallop. ABDOMEN:  Soft, non-tender, with active bowel sounds, and no hepatosplenomegaly.  No masses. SKIN:  Ankle tattoo.  Freckles.  No rashes, ulcers or lesions. EXTREMITIES: No edema, no skin discoloration or tenderness.  No palpable cords. LYMPH NODES: No palpable cervical, supraclavicular, axillary or inguinal adenopathy  NEUROLOGICAL: Unremarkable. PSYCH:  Appropriate.  No visits with results within 3 Day(s) from this visit.  Latest known visit with results is:  Admission on 09/15/2015, Discharged on 09/15/2015   Component Date Value Ref Range Status  . SURGICAL PATHOLOGY 09/19/2015    Final                   Value:Surgical Pathology CASE: [id number] PATIENT: Jody Roberts Surgical Pathology Report     SPECIMEN SUBMITTED: A. Ovary, right  CLINICAL HISTORY: None provided  PRE-OPERATIVE DIAGNOSIS: Pelvic pain, right ovarian cyst  POST-OPERATIVE DIAGNOSIS: Same as pre-op     DIAGNOSIS: A. RIGHT OVARY; LAPAROSCOPIC RIGHT OOPHORECTOMY: - FOLLICLE CYSTS AND PROMINENT CORPUS LUTEUM.   GROSS DESCRIPTION:  A. Labeled: right ovary  Tissue fragment(s): 1  Size: 14 g, 4.2 x 2 by 1.6 cm  Description: ovary with some attached fibrous tissue, external surface intact lobulated pink-tan with focal fibrous adhesion with focal what appears surgical purple marking, external surface inked blue, sectioned to reveal heterogeneous orange purple tan parenchyma with multiple unilocular clear and bloody fluid-filled cysts up to 0.8 cm in greatest dimension  Representative submitted in 1-3 cassette(s).    Final Diagnosis performed by Delorse Lek, MD.  Electronically signed 6/1  05/2015 12:19:32PM    The electronic signature indicates that the named Attending Pathologist has evaluated the specimen  Technical component performed at [facility], [address] [place], [place] [address] Lab: [phone] Dir: Darrick Penna. Evette Doffing, MD  Professional component performed at [facility], [facility], [place], [place], [place] [address] Lab: [phone] Dir: Dellia Nims. Reuel Derby, MD      Assessment:  Jody Roberts is a 30 y.o. female with a family history of hemochromatosis.  She has an identical twin with H63D mutation as well as an unidentified mutation.  Her twin requires regular phlebotomy.  Her father and brother are unaffected carriers.  She underwent hysterectomy on 01/06/2015 for menorrhagia.  Labs on 10/28/2014 included the following normal  studies:  hematocrit (44.4), hemoglobin (15.0), ferritin (104), iron saturation (29%), ESR, LFTs, and rheumatoid factor.  Labs on 08/17/2015 included a hematocrit of 48.3, hemoglobin 16.7, MCV 91.3, platelets 345,000, white count 13,500.   Symptomatically, she has arthritis.  Exam is unremarkable.  Plan: 1.  Review diagnosis, signs and symptoms, and management of hemochromatosis.  Suspect patient has hemachromatosis or carrier state as identical twin has disease and requires treatment.  Discuss iron loading in women after menopausee or hysterectomy.  Discuss prior iron testing (normal). 2.  Labs today:  CBC with diff, ferritin, iron studies, liver function tests, hemochromatosis assay. 3.  Encourage smoking cessation. 4.  Discuss concern for excess Benadryl use. 5.  RTC in 2 weeks for MD assessment and review of testing.   Lequita Asal, MD  12/30/2015, 3:28 PM

## 2016-01-01 ENCOUNTER — Encounter: Payer: Self-pay | Admitting: Hematology and Oncology

## 2016-01-04 LAB — HEMOCHROMATOSIS DNA-PCR(C282Y,H63D)

## 2016-01-17 ENCOUNTER — Inpatient Hospital Stay: Payer: Self-pay | Attending: Hematology and Oncology | Admitting: Hematology and Oncology

## 2016-01-17 ENCOUNTER — Other Ambulatory Visit: Payer: Self-pay | Admitting: Nurse Practitioner

## 2016-01-17 DIAGNOSIS — K5909 Other constipation: Secondary | ICD-10-CM | POA: Insufficient documentation

## 2016-01-17 DIAGNOSIS — R1013 Epigastric pain: Secondary | ICD-10-CM

## 2016-01-17 NOTE — Progress Notes (Deleted)
[facility] Clinic day:  01/17/2016  Chief Complaint: Jody Roberts is a 30 y.o. female with a family history of hemochromatosis who is seen for review of testing and discussion regarding direction of therapy.  HPI: The patient was last seen in the hematology clinic on 12/30/2015.  At that time, she noted that her identical twin sister was heterozygote for H63D gene as well as an unidentified gene which caused her to load iron.  Her sister had symptoms of fatigue, arthritis, and hair loss. She underwent regular phlebotomy.  The patient's father and brother were unaffected carriers.  She had undergone hysterectomy on 01/06/2015.  Prior to surgery, she had heavy menses. She had arthritis.  Rheumatoid factor was negative.  Work-up on 12/30/2015 revealed a hematocrit of 42.9, hemoglobin 15.0, MCV 94.3, platelets 271,000, white count 8100.  Hemochromatosis assay revealed a single mutation (H63D).  Liver function tests were normal.  Ferritin was 41. Iron studies revealed a saturation of 17% and a TIBC of 402.   Past Medical History:  Diagnosis Date  . Abnormal uterine bleeding   . Bipolar disorder ([place])   . Eating disorder   . Hemochromatosis   . IBS (irritable bowel syndrome)   . Palpitations    INTERMITTENT-PT DENIES SYMPTOMS OF SOB, DIZZINES ETC    Past Surgical History:  Procedure Laterality Date  . CYSTOSCOPY  01/06/2015   Procedure: CYSTOSCOPY;  Surgeon: Gae Dry, MD;  Location: [facility] ORS;  Service: Gynecology;;  . LAPAROSCOPIC HYSTERECTOMY Bilateral 01/06/2015   Procedure: HYSTERECTOMY TOTAL LAPAROSCOPIC/BILATERAL SALPINECTOMY;  Surgeon: Gae Dry, MD;  Location: [facility] ORS;  Service: Gynecology;  Laterality: Bilateral;  . LAPAROSCOPIC OVARIAN CYSTECTOMY Right 09/15/2015   Procedure: LAPAROSCOPIC RIGHT OOPHERECTOMY ;  Surgeon: Honor Loh Ward, MD;  Location: [facility] OR[facility];  Service: Gynecology;  Laterality: Right;  . REPAIR VAGINAL CUFF N/A  09/15/2015   Procedure: REPAIR VAGINAL CUFF;  Surgeon: Honor Loh Ward, MD;  Location: [facility] ORS;  Service: Gynecology;  Laterality: N/A;  . WISDOM TOOTH EXTRACTION      No family history on file.  Social History:  reports that she has been smoking Cigarettes.  She has a 13.00 pack-year smoking history. She has never used smokeless tobacco. She reports that she drinks alcohol. She reports that she does not use drugs.  She has smoked 1 pack a day for 10-12 years.  Her mother is a Restaurant manager, fast food.  She works as a Actor at [employer]. 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  Allergies: No Known Allergies  Current Medications: Current Outpatient Prescriptions  Medication Sig Dispense Refill  . acyclovir (ZOVIRAX) 200 MG capsule Take by mouth.    . clonazePAM (KLONOPIN) 0.5 MG tablet Take 0.5 mg by mouth at bedtime.    Marland Kitchen ibuprofen (ADVIL,MOTRIN) 600 MG tablet Take 1 tablet (600 mg total) by mouth every 6 (six) hours. (Patient not taking: Reported on 12/30/2015) 31 tablet 0  . meloxicam (MOBIC) 7.5 MG tablet Take 7.5 mg by mouth daily.    . QUEtiapine (SEROQUEL) 100 MG tablet Take 100 mg by mouth at bedtime.     No current facility-administered medications for this visit.     Review of Systems:  GENERAL:  Feels "ok".  No fevers or sweats.  Weight loss of 15 pounds in the past month. PERFORMANCE STATUS (ECOG):  1 HEENT:  No visual changes, runny nose, sore throat, mouth sores or tenderness. Lungs: No shortness of breath or cough.  No hemoptysis.  Cardiac:  Palpitations at night.  No chest pain, orthopnea, or PND. GI:  Constipation x 15 years.  Emesis.  No nausea, diarrhea,melena or hematochezia. GU:  No urgency, frequency, dysuria, or hematuria. Musculoskeletal:  No back pain.  Arthritis.  No muscle tenderness. Extremities:  No pain or swelling. Skin:  No rashes or skin changes. Neuro:  No headache, numbness or weakness, balance or coordination issues. Endocrine:  No diabetes, thyroid issues, hot  flashes or night sweats. Psych:  Bipolar disorder.  Poor sleep.  No mood changes, depression or anxiety. Pain:  No focal pain. Review of systems:  All other systems reviewed and found to be negative.  Physical Exam: Last menstrual period 12/20/2014. GENERAL:  Well developed, well nourished, woman sitting comfortably in the exam room in no acute distress. MENTAL STATUS:  Alert and oriented to person, place and time. HEAD:  Auburn hair pulled up.  Normocephalic, atraumatic, face symmetric, no Cushingoid features. EYES: Green eyes.  Pupils equal round and reactive to light and accomodation.  No conjunctivitis or scleral icterus. ENT:  Oropharynx clear without lesion.  Tongue normal. Mucous membranes moist.  RESPIRATORY:  Clear to auscultation without rales, wheezes or rhonchi. CARDIOVASCULAR:  Regular rate and rhythm without murmur, rub or gallop. ABDOMEN:  Soft, non-tender, with active bowel sounds, and no hepatosplenomegaly.  No masses. SKIN:  Ankle tattoo.  Freckles.  No rashes, ulcers or lesions. EXTREMITIES: No edema, no skin discoloration or tenderness.  No palpable cords. LYMPH NODES: No palpable cervical, supraclavicular, axillary or inguinal adenopathy  NEUROLOGICAL: Unremarkable. PSYCH:  Appropriate.  No visits with results within 3 Day(s) from this visit.  Latest known visit with results is:  Appointment on 12/30/2015  Component Date Value Ref Range Status  . DNA Mutation Analysis 01/04/2016 Comment   Final   Comment: (NOTE) Result: CARRIER Single mutation (H63D) identified Interpretation: This patient's sample was analyzed for the hereditary hemochromatosis (HH) mutations C282Y, H63D, and S65C.  A single copy of H63D was identified.  Results for C282Y and S65C were negative. This person is most likely an unaffected carrier. Approximately 1 in 9 Caucasians are carriers of HH.  The mutations analyzed by [facility] are most common in the Caucasian population. Because this panel  does not identify rare HH mutations or HH mutations found in other ethnic groups, there are a small number of people who may have a single copy of H63D who are actually affected. The diagnosis of HH should include clinical findings and other test results, such as transferrin-iron saturation and/or serum ferritin studies and/or liver biopsy.  HH is inherited in a recessive manner. Should this individual have children with a partner who is also a carrier for [facility], there is a 25% chance per offspring that he/she is aff                          ected.  Genetic counseling and HH molecular testing are recommended for at-risk family members. Methodology: DNA Analysis of the HFE gene was performed by PCR amplification followed by restriction enzyme digestion analyses. Reference: Cathe Mons and Walker AP. (2000). Genet Test 4:97-101. Cogswell ME et al. (1999). AM J Prev Med 16:134-140. Bomford A (2002). Lancet 360(9346):1673-81. Stan Head et al. (2002). Blood Cells, Molecules. and Diseases.  29(3):418-432. Imperatore G et al. (2003). Genet Med. 5(1):1-8. Cogswell ME et al. (2003). Genet Med. 5(4):304-10. This test was developed and its performance characteristics determined by [facility]. It has not been cleared  or approved by the Food and Drug Administration. Genetic counselors are available for health care providers to discuss results at 1-800-345-GENE. Allison Quarry, PhD, [facility] Ruben Reason, PhD, [facility] Jens Som, PhD, [facility] Annetta Maw, M.S., PhD, [facility] Alfredo Bach, PhD, [facility]                          Kaylyn Layer, PhD, [facility] Earlean Polka, PhD, [facility] Performed At: [facility] [address] [place], [place] [id number] Nechama Guard MD KV:[phone]   . Ferritin 12/30/2015 41  11 - 307 ng/mL Final  . Iron 12/30/2015 68  28 - 170 ug/dL Final  . TIBC 12/30/2015 402  250 - 450 ug/dL Final  . Saturation Ratios 12/30/2015 17  10.4 - 31.8 % Final  .  UIBC 12/30/2015 334  ug/dL Final  . Total Protein 12/30/2015 7.4  6.5 - 8.1 g/dL Final  . Albumin 12/30/2015 4.4  3.5 - 5.0 g/dL Final  . AST 12/30/2015 22  15 - 41 U/L Final  . ALT 12/30/2015 17  14 - 54 U/L Final  . Alkaline Phosphatase 12/30/2015 42  38 - 126 U/L Final  . Total Bilirubin 12/30/2015 0.7  0.3 - 1.2 mg/dL Final  . Bilirubin, Direct 12/30/2015 <0.1* 0.1 - 0.5 mg/dL Final  . Indirect Bilirubin 12/30/2015 NOT CALCULATED  0.3 - 0.9 mg/dL Final  . WBC 12/30/2015 8.1  3.6 - 11.0 K/uL Final  . RBC 12/30/2015 4.56  3.80 - 5.20 MIL/uL Final  . Hemoglobin 12/30/2015 15.0  12.0 - 16.0 g/dL Final  . HCT 12/30/2015 42.9  35.0 - 47.0 % Final  . MCV 12/30/2015 94.3  80.0 - 100.0 fL Final  . MCH 12/30/2015 32.9  26.0 - 34.0 pg Final  . MCHC 12/30/2015 34.9  32.0 - 36.0 g/dL Final  . RDW 12/30/2015 13.7  11.5 - 14.5 % Final  . Platelets 12/30/2015 271  150 - 440 K/uL Final  . Neutrophils Relative % 12/30/2015 46  % Final  . Neutro Abs 12/30/2015 3.7  1.4 - 6.5 K/uL Final  . Lymphocytes Relative 12/30/2015 39  % Final  . Lymphs Abs 12/30/2015 3.2  1.0 - 3.6 K/uL Final  . Monocytes Relative 12/30/2015 8  % Final  . Monocytes Absolute 12/30/2015 0.6  0.2 - 0.9 K/uL Final  . Eosinophils Relative 12/30/2015 7  % Final  . Eosinophils Absolute 12/30/2015 0.6  0 - 0.7 K/uL Final  . Basophils Relative 12/30/2015 0  % Final  . Basophils Absolute 12/30/2015 0.0  0 - 0.1 K/uL Final    Assessment:  Jody Roberts is a 30 y.o. female with a family history of hemochromatosis.  She has an identical twin with H63D mutation as well as an unidentified mutation.  Her twin requires regular phlebotomy.  Her father and brother are unaffected carriers.  She underwent hysterectomy on 01/06/2015 for menorrhagia.  Labs on 10/28/2014 included the following normal studies:  hematocrit (44.4), hemoglobin (15.0), ferritin (104), iron saturation (29%), ESR, LFTs, and rheumatoid factor.  Labs on 08/17/2015 included  a hematocrit of 48.3, hemoglobin 16.7, MCV 91.3, platelets 345,000, white count 13,500.   Symptomatically, she has arthritis.  Exam is unremarkable.  Plan: 1.  Review diagnosis, signs and symptoms, and management of hemochromatosis.  Suspect patient has hemachromatosis or carrier state as identical twin has disease and requires treatment.  Discuss iron loading in women after menopausee or hysterectomy.  Discuss prior  iron testing (normal). 2.  Labs today:  CBC with diff, ferritin, iron studies, liver function tests, hemochromatosis assay. 3.  Encourage smoking cessation. 4.  Discuss concern for excess Benadryl use. 5.  RTC in 2 weeks for MD assessment and review of testing.   Lequita Asal, MD  01/17/2016, 5:48 AM

## 2016-01-20 ENCOUNTER — Ambulatory Visit: Payer: Self-pay

## 2017-04-15 ENCOUNTER — Other Ambulatory Visit: Payer: Self-pay

## 2017-04-15 ENCOUNTER — Encounter: Payer: Self-pay | Admitting: Emergency Medicine

## 2017-04-15 ENCOUNTER — Emergency Department: Payer: Self-pay

## 2017-04-15 ENCOUNTER — Emergency Department
Admission: EM | Admit: 2017-04-15 | Discharge: 2017-04-15 | Disposition: A | Discharge status: Home / Self Care | Payer: Self-pay | Attending: Emergency Medicine | Admitting: Emergency Medicine

## 2017-04-15 DIAGNOSIS — Y33XXXA Other specified events, undetermined intent, initial encounter: Secondary | ICD-10-CM | POA: Insufficient documentation

## 2017-04-15 DIAGNOSIS — Y929 Unspecified place or not applicable: Secondary | ICD-10-CM | POA: Insufficient documentation

## 2017-04-15 DIAGNOSIS — Y9389 Activity, other specified: Secondary | ICD-10-CM | POA: Insufficient documentation

## 2017-04-15 DIAGNOSIS — Y999 Unspecified external cause status: Secondary | ICD-10-CM | POA: Insufficient documentation

## 2017-04-15 DIAGNOSIS — F17210 Nicotine dependence, cigarettes, uncomplicated: Secondary | ICD-10-CM | POA: Insufficient documentation

## 2017-04-15 DIAGNOSIS — R070 Pain in throat: Secondary | ICD-10-CM | POA: Insufficient documentation

## 2017-04-15 DIAGNOSIS — Z79899 Other long term (current) drug therapy: Secondary | ICD-10-CM | POA: Insufficient documentation

## 2017-04-15 DIAGNOSIS — T17208A Unspecified foreign body in pharynx causing other injury, initial encounter: Secondary | ICD-10-CM | POA: Insufficient documentation

## 2017-04-15 LAB — CBC
HEMATOCRIT: 43.9 % (ref 35.0–47.0)
HEMOGLOBIN: 15.3 g/dL (ref 12.0–16.0)
MCH: 32.7 pg (ref 26.0–34.0)
MCHC: 34.7 g/dL (ref 32.0–36.0)
MCV: 94.1 fL (ref 80.0–100.0)
Platelets: 293 10*3/uL (ref 150–440)
RBC: 4.67 MIL/uL (ref 3.80–5.20)
RDW: 13.7 % (ref 11.5–14.5)
WBC: 9.5 10*3/uL (ref 3.6–11.0)

## 2017-04-15 LAB — BASIC METABOLIC PANEL
ANION GAP: 8 (ref 5–15)
BUN: 12 mg/dL (ref 6–20)
CHLORIDE: 106 mmol/L (ref 101–111)
CO2: 24 mmol/L (ref 22–32)
Calcium: 9.4 mg/dL (ref 8.9–10.3)
Creatinine, Ser: 0.78 mg/dL (ref 0.44–1.00)
GFR calc Af Amer: >60 mL/min (ref >60)
GLUCOSE: 112 mg/dL — AB (ref 65–99)
POTASSIUM: 4 mmol/L (ref 3.5–5.1)
Sodium: 138 mmol/L (ref 135–145)

## 2017-04-15 LAB — TROPONIN I: Troponin I: <0.03 ng/mL (ref <0.03)

## 2017-04-15 NOTE — ED Notes (Signed)
Pt believes that she swallowed fishbone in fried fish that she ate Saturday night. Able to eat and drink since, but reports sore left side of throat. Pt also reports reoccurring chest pain to left side of chest, burning in nature-occurs with movement and is intermittent, lasting for approx 2-3 minutes with intensity. Pt reporting ongoing headache for "months", starts at bilateral base of neck and radiates to top of head, photosensitivity as well. Pt alert and oriented X4, active, cooperative, pt in NAD. RR even and unlabored, color WNL.

## 2017-04-15 NOTE — ED Provider Notes (Signed)
[facility] Emergency Department Provider Note  ____________________________________________   First MD Initiated Contact with Patient 04/15/17 1032     (approximate)  I have reviewed the triage vital signs and the nursing notes.   HISTORY  Chief Complaint Swallowed Foreign Body and Chest Pain    HPI Jody Roberts is a [age] y.o. female who self presents to the emergency department with roughly 48 hours of all began shortly after eating fried whitefish on Saturday.  She says ever since then she has felt itching and scratching in the back of her throat and she feels like a bone is stuck.  She has had no drooling.  No difficulty eating or drinking.  No change in her voice.  No fevers or chills.  Her symptoms began suddenly.  Has been constant ever since.  They are somewhat worsened by eating and somewhat improved by not eating.  Past Medical History:  Diagnosis Date  . Abnormal uterine bleeding   . Bipolar disorder (HCC)   . Eating disorder   . Hemochromatosis   . IBS (irritable bowel syndrome)   . Palpitations    INTERMITTENT-PT DENIES SYMPTOMS OF SOB, DIZZINES ETC    Patient Active Problem List   Diagnosis Date Noted  . Family history of hemochromatosis 12/30/2015    Past Surgical History:  Procedure Laterality Date  . CYSTOSCOPY  01/06/2015   Procedure: CYSTOSCOPY;  Surgeon: Nadara Mustardobert P Harris, MD;  Location: [facility] ORS;  Service: Gynecology;;  . LAPAROSCOPIC HYSTERECTOMY Bilateral 01/06/2015   Procedure: HYSTERECTOMY TOTAL LAPAROSCOPIC/BILATERAL SALPINECTOMY;  Surgeon: Nadara Mustardobert P Harris, MD;  Location: [facility] ORS;  Service: Gynecology;  Laterality: Bilateral;  . LAPAROSCOPIC OVARIAN CYSTECTOMY Right 09/15/2015   Procedure: LAPAROSCOPIC RIGHT OOPHERECTOMY ;  Surgeon: Elenora Fenderhelsea C Ward, MD;  Location: [facility] ORS;  Service: Gynecology;  Laterality: Right;  . REPAIR VAGINAL CUFF N/A 09/15/2015   Procedure: REPAIR VAGINAL CUFF;  Surgeon: Elenora Fenderhelsea C Ward, MD;  Location:  [facility] ORS;  Service: Gynecology;  Laterality: N/A;  . WISDOM TOOTH EXTRACTION      Prior to Admission medications   Medication Sig Start Date End Date Taking? Authorizing Provider  acyclovir (ZOVIRAX) 200 MG capsule Take by mouth.    [provider]  clonazePAM (KLONOPIN) 0.5 MG tablet Take 0.5 mg by mouth at bedtime.    [provider]  ibuprofen (ADVIL,MOTRIN) 600 MG tablet Take 1 tablet (600 mg total) by mouth every 6 (six) hours. Patient not taking: Reported on 12/30/2015 09/15/15   Ward, Elenora Fenderhelsea C, MD  meloxicam (MOBIC) 7.5 MG tablet Take 7.5 mg by mouth daily.    [provider]  QUEtiapine (SEROQUEL) 100 MG tablet Take 100 mg by mouth at bedtime.    [provider]    Allergies Patient has no known allergies.  No family history on file.  Social History Social History   Tobacco Use  . Smoking status: Current Every Day Smoker    Packs/day: 1.00    Years: 13.00    Pack years: 13.00    Types: Cigarettes  . Smokeless tobacco: Never Used  Substance Use Topics  . Alcohol use: Yes    Comment: occ  . Drug use: No    Review of Systems Constitutional: No fever/chills ENT: Positive for sore throat. Cardiovascular: Denies chest pain. Respiratory: Denies shortness of breath. Gastrointestinal: No abdominal pain.  No nausea, no vomiting.  No diarrhea.  No constipation. Musculoskeletal: Negative for back pain. Neurological: Negative for headaches   ____________________________________________  PHYSICAL EXAM:  VITAL SIGNS: ED Triage Vitals [04/15/17 0838]  Enc Vitals Group     BP (!) 149/67     Pulse Rate (!) 105     Resp      Temp 98.2 F (36.8 C)     Temp Source Oral     SpO2 100 %     Weight 120 lb (54.4 kg)     Height 5\' 2"  (1.575 m)     Head Circumference      Peak Flow      Pain Score 6     Pain Loc      Pain Edu?      Excl. in GC?     Constitutional: Alert and oriented x4 well-appearing nontoxic no diaphoresis speaks  in full clear sentences Head: Atraumatic. Nose: No congestion/rhinnorhea. Mouth/Throat: No trismus uvula midline no pharyngeal erythema or exudate no drooling no foreign bodies identified Neck: No stridor.   Cardiovascular: Tachycardic regular rhythm no murmurs Respiratory: Normal respiratory effort.  No retractions. Gastrointestinal: Soft nontender Neurologic:  Normal speech and language. No gross focal neurologic deficits are appreciated.  Skin:  Skin is warm, dry and intact. No rash noted.    ____________________________________________  LABS (all labs ordered are listed, but only abnormal results are displayed)  Labs Reviewed  BASIC METABOLIC PANEL - Abnormal; Notable for the following components:      Result Value   Glucose, Bld 112 (*)    All other components within normal limits  CBC  TROPONIN I    Lab work reviewed by me with no acute disease __________________________________________  EKG  ED ECG REPORT I, Merrily Brittle, the attending physician, personally viewed and interpreted this ECG.  Date: 04/15/2017 EKG Time:  Rate: 65 Rhythm: normal sinus rhythm QRS Axis: normal Intervals: normal ST/T Wave abnormalities: normal Narrative Interpretation: no evidence of acute ischemia  ____________________________________________  RADIOLOGY  X-ray of the neck reviewed by me consistent with possible ____________________________________________   DIFFERENTIAL includes but not limited to  Esophageal foreign body, pharyngeal foreign body, esophageal perforation   PROCEDURES  Procedure(s) performed: no  Procedures  Critical Care performed: no  Observation: no ____________________________________________   INITIAL IMPRESSION / ASSESSMENT AND PLAN / ED COURSE  Pertinent labs & imaging results that were available during my care of the patient were reviewed by me and considered in my medical decision making (see chart for details).  Patient is very  well-appearing and protecting her airway.  Her history is concerning for retained foreign body in her x-ray is concerning as well.  I have a call out to ENT now.     I discussed with Dr. Elenore Rota who will kindly evaluate the patient in clinic tomorrow morning at 8:55 AM.  Patient is able to eat and drink.  She is protecting her airway.  She has no signs of perforation.  Strict return precautions have been given and the patient verbalizes understanding and agreement the plan. ____________________________________________   FINAL CLINICAL IMPRESSION(S) / ED DIAGNOSES  Final diagnoses:  Foreign body in pharynx, initial encounter      NEW MEDICATIONS STARTED DURING THIS VISIT:  This SmartLink is deprecated. Use AVSMEDLIST instead to display the medication list for a patient.   Note:  This document was prepared using Dragon voice recognition software and may include unintentional dictation errors.      Merrily Brittle, MD 04/16/17 1116

## 2017-04-15 NOTE — Discharge Instructions (Addendum)
Please go to your appointment with the ear nose and throat doctor tomorrow morning at 8:30 in the morning to have your bone evaluated and removed.  Return to the emergency department tonight for any concerns such as worsening pain, fevers, chills, shortness of breath, or for any other issues.  It was a pleasure to take care of you today, and thank you for coming to our emergency department.  If you have any questions or concerns before leaving please ask the nurse to grab me and I'm more than happy to go through your aftercare instructions again.  If you were prescribed any opioid pain medication today such as Norco, Vicodin, Percocet, morphine, hydrocodone, or oxycodone please make sure you do not drive when you are taking this medication as it can alter your ability to drive safely.  If you have any concerns once you are home that you are not improving or are in fact getting worse before you can make it to your follow-up appointment, please do not hesitate to call 911 and come back for further evaluation.  Merrily BrittleNeil Zakariah Dejarnette, MD  Results for orders placed or performed during the hospital encounter of 04/15/17  Basic metabolic panel  Result Value Ref Range   Sodium 138 135 - 145 mmol/L   Potassium 4.0 3.5 - 5.1 mmol/L   Chloride 106 101 - 111 mmol/L   CO2 24 22 - 32 mmol/L   Glucose, Bld 112 (H) 65 - 99 mg/dL   BUN 12 6 - 20 mg/dL   Creatinine, Ser 9.560.78 0.44 - 1.00 mg/dL   Calcium 9.4 8.9 - 21.310.3 mg/dL   GFR calc non Af Amer >60 >60 mL/min   GFR calc Af Amer >60 >60 mL/min   Anion gap 8 5 - 15  CBC  Result Value Ref Range   WBC 9.5 3.6 - 11.0 K/uL   RBC 4.67 3.80 - 5.20 MIL/uL   Hemoglobin 15.3 12.0 - 16.0 g/dL   HCT 08.643.9 57.835.0 - 46.947.0 %   MCV 94.1 80.0 - 100.0 fL   MCH 32.7 26.0 - 34.0 pg   MCHC 34.7 32.0 - 36.0 g/dL   RDW 62.913.7 52.811.5 - 41.314.5 %   Platelets 293 150 - 440 K/uL  Troponin I  Result Value Ref Range   Troponin I <0.03 <0.03 ng/mL   Dg Neck Soft Tissue  Result Date:  04/15/2017 CLINICAL DATA:  Possible fish bone stuck in throat. EXAM: NECK SOFT TISSUES - 1+ VIEW COMPARISON:  None. FINDINGS: There is no evidence of retropharyngeal soft tissue swelling or epiglottic enlargement. Linear densities are noted just inferior to the vallecula and above the cords anteriorly. Cannot exclude foreign body. IMPRESSION: Cannot exclude linear foreign body just inferior to the vallecula. This could be further evaluated with noncontrast neck CT. Electronically Signed   By: Charlett NoseKevin  Dover M.D.   On: 04/15/2017 09:23

## 2017-04-15 NOTE — ED Triage Notes (Signed)
States feels like fish bone stuck in throat x 2 days. Also chest pain "x month or two". States does have history of palpitations.

## 2017-04-15 NOTE — ED Notes (Signed)
Pt alert and oriented X4, active, cooperative, pt in NAD. RR even and unlabored, color WNL.  Pt informed to return if any life threatening symptoms occur.  Discharge and followup instructions reviewed. Pt left with all of belongings. 

## 2017-09-08 ENCOUNTER — Emergency Department: Payer: Medicaid Other

## 2017-09-08 ENCOUNTER — Inpatient Hospital Stay
Admission: EM | Admit: 2017-09-08 | Discharge: 2017-09-13 | DRG: 419 | Disposition: A | Discharge status: Home / Self Care | Payer: Medicaid Other | Attending: Internal Medicine | Admitting: Internal Medicine

## 2017-09-08 ENCOUNTER — Encounter: Payer: Self-pay | Admitting: Emergency Medicine

## 2017-09-08 ENCOUNTER — Other Ambulatory Visit: Payer: Self-pay

## 2017-09-08 DIAGNOSIS — E860 Dehydration: Secondary | ICD-10-CM | POA: Diagnosis present

## 2017-09-08 DIAGNOSIS — G8929 Other chronic pain: Secondary | ICD-10-CM | POA: Diagnosis present

## 2017-09-08 DIAGNOSIS — E876 Hypokalemia: Secondary | ICD-10-CM | POA: Diagnosis present

## 2017-09-08 DIAGNOSIS — K828 Other specified diseases of gallbladder: Principal | ICD-10-CM | POA: Diagnosis present

## 2017-09-08 DIAGNOSIS — K581 Irritable bowel syndrome with constipation: Secondary | ICD-10-CM | POA: Diagnosis present

## 2017-09-08 DIAGNOSIS — R109 Unspecified abdominal pain: Secondary | ICD-10-CM | POA: Diagnosis present

## 2017-09-08 DIAGNOSIS — R1011 Right upper quadrant pain: Secondary | ICD-10-CM

## 2017-09-08 DIAGNOSIS — F17210 Nicotine dependence, cigarettes, uncomplicated: Secondary | ICD-10-CM | POA: Diagnosis present

## 2017-09-08 LAB — HEPATIC FUNCTION PANEL
ALK PHOS: 38 U/L (ref 38–126)
ALT: 17 U/L (ref 14–54)
AST: 30 U/L (ref 15–41)
Albumin: 4.7 g/dL (ref 3.5–5.0)
BILIRUBIN DIRECT: 0.1 mg/dL (ref 0.1–0.5)
BILIRUBIN TOTAL: 0.6 mg/dL (ref 0.3–1.2)
Indirect Bilirubin: 0.5 mg/dL (ref 0.3–0.9)
Total Protein: 7.6 g/dL (ref 6.5–8.1)

## 2017-09-08 LAB — CBC WITH DIFFERENTIAL/PLATELET
BASOS PCT: 1 %
Basophils Absolute: 0.1 10*3/uL (ref 0–0.1)
EOS ABS: 0.4 10*3/uL (ref 0–0.7)
Eosinophils Relative: 4 %
HCT: 42.8 % (ref 35.0–47.0)
Hemoglobin: 15 g/dL (ref 12.0–16.0)
LYMPHS ABS: 1.6 10*3/uL (ref 1.0–3.6)
Lymphocytes Relative: 15 %
MCH: 33.7 pg (ref 26.0–34.0)
MCHC: 35 g/dL (ref 32.0–36.0)
MCV: 96.5 fL (ref 80.0–100.0)
MONO ABS: 0.5 10*3/uL (ref 0.2–0.9)
MONOS PCT: 5 %
Neutro Abs: 8.1 10*3/uL — ABNORMAL HIGH (ref 1.4–6.5)
Neutrophils Relative %: 75 %
Platelets: 296 10*3/uL (ref 150–440)
RBC: 4.44 MIL/uL (ref 3.80–5.20)
RDW: 13.6 % (ref 11.5–14.5)
WBC: 10.7 10*3/uL (ref 3.6–11.0)

## 2017-09-08 LAB — URINALYSIS, COMPLETE (UACMP) WITH MICROSCOPIC
BACTERIA UA: NONE SEEN
Bilirubin Urine: NEGATIVE
Glucose, UA: NEGATIVE mg/dL
HGB URINE DIPSTICK: NEGATIVE
Ketones, ur: 5 mg/dL — AB
Leukocytes, UA: NEGATIVE
NITRITE: NEGATIVE
PROTEIN: 100 mg/dL — AB
SPECIFIC GRAVITY, URINE: 1.029 (ref 1.005–1.030)
pH: 6 (ref 5.0–8.0)

## 2017-09-08 LAB — BASIC METABOLIC PANEL
ANION GAP: 9 (ref 5–15)
BUN: 14 mg/dL (ref 6–20)
CHLORIDE: 108 mmol/L (ref 101–111)
CO2: 23 mmol/L (ref 22–32)
Calcium: 9.1 mg/dL (ref 8.9–10.3)
Creatinine, Ser: 0.91 mg/dL (ref 0.44–1.00)
GFR calc Af Amer: >60 mL/min (ref >60)
GFR calc non Af Amer: >60 mL/min (ref >60)
GLUCOSE: 138 mg/dL — AB (ref 65–99)
POTASSIUM: 3.8 mmol/L (ref 3.5–5.1)
Sodium: 140 mmol/L (ref 135–145)

## 2017-09-08 LAB — POCT PREGNANCY, URINE: Preg Test, Ur: NEGATIVE

## 2017-09-08 LAB — PREGNANCY, URINE: PREG TEST UR: NEGATIVE

## 2017-09-08 MED ORDER — MORPHINE SULFATE (PF) 4 MG/ML IV SOLN
4.0000 mg | Freq: Once | INTRAVENOUS | Status: AC
  Administered 2017-09-08: 4 mg via INTRAVENOUS
  Filled 2017-09-08: qty 1

## 2017-09-08 MED ORDER — DIPHENHYDRAMINE HCL 25 MG PO CAPS
25.0000 mg | ORAL_CAPSULE | Freq: Every evening | ORAL | Status: DC | PRN
  Administered 2017-09-08: 50 mg via ORAL
  Filled 2017-09-08: qty 2
  Filled 2017-09-08: qty 1

## 2017-09-08 MED ORDER — ONDANSETRON HCL 4 MG/2ML IJ SOLN
4.0000 mg | Freq: Once | INTRAMUSCULAR | Status: AC
  Administered 2017-09-08: 4 mg via INTRAVENOUS
  Filled 2017-09-08: qty 2

## 2017-09-08 MED ORDER — SODIUM CHLORIDE 0.9 % IV BOLUS
1000.0000 mL | Freq: Once | INTRAVENOUS | Status: AC
  Administered 2017-09-08: 1000 mL via INTRAVENOUS

## 2017-09-08 MED ORDER — SIMETHICONE 80 MG PO CHEW
80.0000 mg | CHEWABLE_TABLET | Freq: Every day | ORAL | Status: DC
Start: 2017-09-09 — End: 2017-09-13
  Administered 2017-09-09 – 2017-09-13 (×4): 80 mg via ORAL
  Filled 2017-09-08 (×6): qty 1

## 2017-09-08 MED ORDER — HYDROMORPHONE HCL 1 MG/ML IJ SOLN
1.0000 mg | Freq: Once | INTRAMUSCULAR | Status: AC
  Administered 2017-09-08: 1 mg via INTRAVENOUS
  Filled 2017-09-08: qty 1

## 2017-09-08 MED ORDER — ACETAMINOPHEN 325 MG PO TABS
650.0000 mg | ORAL_TABLET | Freq: Four times a day (QID) | ORAL | Status: DC | PRN
  Administered 2017-09-13: 650 mg via ORAL
  Filled 2017-09-08: qty 2

## 2017-09-08 MED ORDER — SODIUM CHLORIDE 0.9 % IV SOLN
INTRAVENOUS | Status: DC
  Administered 2017-09-08 – 2017-09-12 (×7): via INTRAVENOUS

## 2017-09-08 MED ORDER — NICOTINE 21 MG/24HR TD PT24
21.0000 mg | MEDICATED_PATCH | Freq: Every day | TRANSDERMAL | Status: DC
  Administered 2017-09-08 – 2017-09-13 (×6): 21 mg via TRANSDERMAL
  Filled 2017-09-08 (×6): qty 1

## 2017-09-08 MED ORDER — HYDROCODONE-ACETAMINOPHEN 5-325 MG PO TABS
1.0000 | ORAL_TABLET | ORAL | Status: DC | PRN
  Administered 2017-09-08 (×2): 2 via ORAL
  Administered 2017-09-08: 1 via ORAL
  Administered 2017-09-09 – 2017-09-11 (×5): 2 via ORAL
  Administered 2017-09-11: 1 via ORAL
  Administered 2017-09-12 – 2017-09-13 (×3): 2 via ORAL
  Administered 2017-09-13: 1 via ORAL
  Filled 2017-09-08 (×3): qty 2
  Filled 2017-09-08: qty 1
  Filled 2017-09-08 (×6): qty 2
  Filled 2017-09-08: qty 1
  Filled 2017-09-08 (×2): qty 2

## 2017-09-08 MED ORDER — MORPHINE SULFATE (PF) 2 MG/ML IV SOLN
2.0000 mg | Freq: Once | INTRAVENOUS | Status: AC
  Administered 2017-09-08: 2 mg via INTRAVENOUS
  Filled 2017-09-08: qty 1

## 2017-09-08 MED ORDER — ONDANSETRON HCL 4 MG/2ML IJ SOLN
INTRAMUSCULAR | Status: AC
  Filled 2017-09-08: qty 2

## 2017-09-08 MED ORDER — KETOROLAC TROMETHAMINE 30 MG/ML IJ SOLN
30.0000 mg | Freq: Four times a day (QID) | INTRAMUSCULAR | Status: DC | PRN
  Administered 2017-09-08 – 2017-09-09 (×3): 30 mg via INTRAVENOUS
  Filled 2017-09-08 (×3): qty 1

## 2017-09-08 MED ORDER — ONDANSETRON HCL 4 MG PO TABS: 4.0000 mg | ORAL_TABLET | Freq: Four times a day (QID) | ORAL | Status: DC | PRN

## 2017-09-08 MED ORDER — ONDANSETRON HCL 4 MG/2ML IJ SOLN
4.0000 mg | Freq: Four times a day (QID) | INTRAMUSCULAR | Status: DC | PRN
Start: 2017-09-08 — End: 2017-09-13
  Administered 2017-09-09 – 2017-09-12 (×7): 4 mg via INTRAVENOUS
  Filled 2017-09-08 (×6): qty 2

## 2017-09-08 MED ORDER — MORPHINE SULFATE (PF) 2 MG/ML IV SOLN
2.0000 mg | INTRAVENOUS | Status: DC | PRN
  Administered 2017-09-08 – 2017-09-12 (×17): 2 mg via INTRAVENOUS
  Filled 2017-09-08 (×18): qty 1

## 2017-09-08 MED ORDER — ONDANSETRON HCL 4 MG/2ML IJ SOLN
4.0000 mg | Freq: Once | INTRAMUSCULAR | Status: AC
  Administered 2017-09-08: 4 mg via INTRAVENOUS

## 2017-09-08 MED ORDER — ACETAMINOPHEN 650 MG RE SUPP: 650.0000 mg | Freq: Four times a day (QID) | RECTAL | Status: DC | PRN

## 2017-09-08 NOTE — Progress Notes (Signed)
Per MD okay for RN to order one time dose for morphine. Pt complaining of pain 10 out of 10.

## 2017-09-08 NOTE — ED Provider Notes (Signed)
[facility] Emergency Department Provider Note ____________________________________________   First MD Initiated Contact with Patient 09/08/17 0740     (approximate)  I have reviewed the triage vital signs and the nursing notes.   HISTORY  Chief Complaint Abdominal Pain    HPI Estill BambergKris S Veazey is a 32 y.o. female with PMH as noted below who presents with right upper quadrant pain, intermittently present for months, but now more severe over the last few days.  The patient was seen 2 days ago at an outside hospital while traveling in [place] and had a negative work-up.  Patient states that she has "gallbladder issues" and that she does not have stones but thinks that she has a bile duct problem that is causing her persistent pain.  She reports associated nausea and vomiting.  She denies diarrhea or fever.   Past Medical History:  Diagnosis Date  . Abnormal uterine bleeding   . Bipolar disorder (HCC)   . Eating disorder   . Hemochromatosis   . IBS (irritable bowel syndrome)   . Palpitations    INTERMITTENT-PT DENIES SYMPTOMS OF SOB, DIZZINES ETC    Patient Active Problem List   Diagnosis Date Noted  . Family history of hemochromatosis 12/30/2015    Past Surgical History:  Procedure Laterality Date  . CYSTOSCOPY  01/06/2015   Procedure: CYSTOSCOPY;  Surgeon: Nadara Mustardobert P Harris, MD;  Location: [facility] ORS;  Service: Gynecology;;  . LAPAROSCOPIC HYSTERECTOMY Bilateral 01/06/2015   Procedure: HYSTERECTOMY TOTAL LAPAROSCOPIC/BILATERAL SALPINECTOMY;  Surgeon: Nadara Mustardobert P Harris, MD;  Location: [facility] ORS;  Service: Gynecology;  Laterality: Bilateral;  . LAPAROSCOPIC OVARIAN CYSTECTOMY Right 09/15/2015   Procedure: LAPAROSCOPIC RIGHT OOPHERECTOMY ;  Surgeon: Elenora Fenderhelsea C Ward, MD;  Location: [facility] ORS;  Service: Gynecology;  Laterality: Right;  . REPAIR VAGINAL CUFF N/A 09/15/2015   Procedure: REPAIR VAGINAL CUFF;  Surgeon: Elenora Fenderhelsea C Ward, MD;  Location: [facility] ORS;  Service:  Gynecology;  Laterality: N/A;  . WISDOM TOOTH EXTRACTION      Prior to Admission medications   Medication Sig Start Date End Date Taking? Authorizing Provider  ibuprofen (ADVIL,MOTRIN) 200 MG tablet Take 1,200 mg by mouth every 6 (six) hours as needed for headache, mild pain or moderate pain (ARTHRITS).   Yes [provider]  omeprazole (PRILOSEC OTC) 20 MG tablet Take 20 mg by mouth daily.   Yes [provider]  simethicone (MYLICON) 80 MG chewable tablet Chew 80 mg by mouth daily.   Yes [provider]  ibuprofen (ADVIL,MOTRIN) 600 MG tablet Take 1 tablet (600 mg total) by mouth every 6 (six) hours. Patient not taking: Reported on 12/30/2015 09/15/15   Ward, Elenora Fenderhelsea C, MD    Allergies Patient has no known allergies.  No family history on file.  Social History Social History   Tobacco Use  . Smoking status: Current Every Day Smoker    Packs/day: 1.00    Years: 13.00    Pack years: 13.00    Types: Cigarettes  . Smokeless tobacco: Never Used  Substance Use Topics  . Alcohol use: Yes    Comment: occ  . Drug use: No    Review of Systems  Constitutional: No fever. Eyes: No redness. ENT: No sore throat. Cardiovascular: Denies chest pain. Respiratory: Denies shortness of breath. Gastrointestinal: Positive for nausea and vomiting. Genitourinary: Negative for dysuria.  Musculoskeletal: Negative for back pain. Skin: Negative for rash. Neurological: Negative for headache.   ____________________________________________   PHYSICAL EXAM:  VITAL SIGNS: ED Triage  Vitals  Enc Vitals Group     BP 09/08/17 0712 137/88     Pulse Rate 09/08/17 0712 (!) 59     Resp 09/08/17 0712 18     Temp 09/08/17 0712 98.5 F (36.9 C)     Temp Source 09/08/17 0712 Oral     SpO2 09/08/17 0712 100 %     Weight 09/08/17 0713 125 lb (56.7 kg)     Height 09/08/17 0713 5\' 2"  (1.575 m)     Head Circumference --      Peak Flow --      Pain Score 09/08/17 0713 9      Pain Loc --      Pain Edu? --      Excl. in GC? --     Constitutional: Alert and oriented.  Uncomfortable appearing, tearful.   Eyes: Conjunctivae are normal.  No scleral icterus. Head: Atraumatic. Nose: No congestion/rhinnorhea. Mouth/Throat: Mucous membranes are moist.   Neck: Normal range of motion.  Cardiovascular:  Good peripheral circulation. Respiratory: Normal respiratory effort.   Gastrointestinal: Soft with moderate right upper quadrant tenderness. No distention.  Genitourinary: No flank tenderness. Musculoskeletal: No lower extremity edema.  Extremities warm and well perfused.  Neurologic:  Normal speech and language. No gross focal neurologic deficits are appreciated.  Skin:  Skin is warm and dry. No rash noted. Psychiatric: Mood and affect are normal. Speech and behavior are normal.  ____________________________________________   LABS (all labs ordered are listed, but only abnormal results are displayed)  Labs Reviewed  BASIC METABOLIC PANEL - Abnormal; Notable for the following components:      Result Value   Glucose, Bld 138 (*)    All other components within normal limits  CBC WITH DIFFERENTIAL/PLATELET - Abnormal; Notable for the following components:   Neutro Abs 8.1 (*)    All other components within normal limits  URINALYSIS, COMPLETE (UACMP) WITH MICROSCOPIC - Abnormal; Notable for the following components:   Color, Urine AMBER (*)    APPearance HAZY (*)    Ketones, ur 5 (*)    Protein, ur 100 (*)    All other components within normal limits  HEPATIC FUNCTION PANEL  PREGNANCY, URINE  POCT PREGNANCY, URINE   ____________________________________________  EKG   ____________________________________________  RADIOLOGY  [place] RUQ: Normal-appearing gallbladder with no acute findings  ____________________________________________   PROCEDURES  Procedure(s) performed: No  Procedures  Critical Care performed:  No ____________________________________________   INITIAL IMPRESSION / ASSESSMENT AND PLAN / ED COURSE  Pertinent labs & imaging results that were available during my care of the patient were reviewed by me and considered in my medical decision making (see chart for details).  32 year old female with PMH as noted above presents with right upper quadrant pain over the last few days, worsened from chronic pain which she has had over several months.  Patient states that she has "gallbladder issues" although no stones, but she believes that she has a bile duct problem.  I reviewed the past medical records in Epic but was not able to find any prior imaging other than a CT in 2017 which was negative for hepatobiliary disease.  On exam, the patient is uncomfortable appearing and tearful, but her vital signs are normal, and she is tender in the right upper quadrant.  Differential includes biliary colic, cholecystitis, bile duct pathology, pancreatitis, gastritis.  Plan: Analgesia, labs, ultrasound, and reassess.  ----------------------------------------- 11:53 AM on 09/08/2017 -----------------------------------------  Lab work-up and ultrasound are unremarkable.  Patient  had improvement in her pain, but it has recurred.  It improves with morphine, but then returns continuously.  The patient continues to be uncomfortable appearing and tearful.  Although her work-up so far has been negative, patient will require admission for intractable abdominal pain.  I signed the patient out to the hospitalist Dr. Tobi Bastos. ____________________________________________   FINAL CLINICAL IMPRESSION(S) / ED DIAGNOSES  Final diagnoses:  Right upper quadrant pain  Right upper quadrant abdominal pain      NEW MEDICATIONS STARTED DURING THIS VISIT:  New Prescriptions   No medications on file     Note:  This document was prepared using Dragon voice recognition software and may include unintentional dictation  errors.    Dionne Bucy, MD 09/08/17 1154

## 2017-09-08 NOTE — Care Management Note (Addendum)
Case Management Note  Patient Details  Name: Jody Roberts MRN: [id number] Date of Birth: November 07, 1985  Subjective/Objective:    Patient admitted to [facility] for colitis.  RNCM consult performed as patient is without insurance. Per the patient she lives at home with her son. She is independent with her ambulation and activities of daily living. She requires no DME. No transportation limitations. She does not have a PCP. Does not take any medications except for OTC medications and does not use a pharmacy.                                Action/Plan:  If discharged on medications may be eligible to get [facility] to obtain any newly ordered medications. [facility] application and network booklet with list of clinics provided to assist with finding a PCP.    Expected Discharge Date:  09/10/17               Expected Discharge Plan:     In-House Referral:     Discharge planning Services     Post Acute Care Choice:    Choice offered to:     DME Arranged:    DME Agency:     HH Arranged:    HH Agency:     Status of Service:     If discussed at [employer] Meetings, dates discussed:    Additional Comments:  Bronte Kropf A, RN 09/08/2017, 4:17 PM

## 2017-09-08 NOTE — ED Triage Notes (Signed)
Patient presents to the ED with intermittent right abdominal pain x 1 week, worse the past 2 days.  Patient is tearful in triage.  Patient reports nausea and vomiting.  Patient states, "I have been so nauseous, I have to make myself throw up to feel better."

## 2017-09-08 NOTE — Progress Notes (Signed)
Advanced care plan. Purpose of the Encounter: CODE STATUS Parties in Attendance:Patient Patient's Decision Capacity:Good Subjective/Patient's story: Presented with nausea vomiting abdominal pain Objective/Medical story Appears dehydrated and has gastritis Goals of care determination:  Advance care directives and goals of care discussed Full cardiac resuscitation, intubation and ventilator if the need arises for now CODE STATUS: Full code Time spent discussing advanced care planning: 16 minutes

## 2017-09-08 NOTE — H&P (Signed)
[facility] - [place] at [facility]   PATIENT NAME: Jody Roberts    MR#:  [id number]  DATE OF BIRTH:  [birth year]  DATE OF ADMISSION:  09/08/2017  PRIMARY CARE PHYSICIAN: Lyndon Code, MD   REQUESTING/REFERRING PHYSICIAN:   CHIEF COMPLAINT:   Chief Complaint  Patient presents with  . Abdominal Pain    HISTORY OF PRESENT ILLNESS: Jody Roberts  is a 32 y.o. female with a known history of bipolar disorder, irritable bowel syndrome, hemochromatosis presented to the emergency room with abdominal discomfort, nausea and vomiting for the last 1 week.  Patient had this abdominal pain getting worse for the last couple of days.  Pain is located in the epigastric area and sharp in nature.  Pain is 7 out of 10 on a scale of 1-10.  Patient was worked up in outside hospital while traveling in [place] couple of days ago and had a negative work-up she was worked up with ultrasound of the abdomen today in our hospital which showed no acute pathology. Has nausea and vomiting and unable to eat.  No fever, diarrhea.  No sick contacts at home.  PAST MEDICAL HISTORY:   Past Medical History:  Diagnosis Date  . Abnormal uterine bleeding   . Bipolar disorder (HCC)   . Eating disorder   . Hemochromatosis   . IBS (irritable bowel syndrome)   . Palpitations    INTERMITTENT-PT DENIES SYMPTOMS OF SOB, DIZZINES ETC    PAST SURGICAL HISTORY:  Past Surgical History:  Procedure Laterality Date  . CYSTOSCOPY  01/06/2015   Procedure: CYSTOSCOPY;  Surgeon: Nadara Mustard, MD;  Location: [facility] OR[facility];  Service: Gynecology;;  . LAPAROSCOPIC HYSTERECTOMY Bilateral 01/06/2015   Procedure: HYSTERECTOMY TOTAL LAPAROSCOPIC/BILATERAL SALPINECTOMY;  Surgeon: Nadara Mustard, MD;  Location: [facility] OR[facility];  Service: Gynecology;  Laterality: Bilateral;  . LAPAROSCOPIC OVARIAN CYSTECTOMY Right 09/15/2015   Procedure: LAPAROSCOPIC RIGHT OOPHERECTOMY ;  Surgeon: Elenora Fender Ward, MD;  Location: [facility] OR[facility];  Service:  Gynecology;  Laterality: Right;  . REPAIR VAGINAL CUFF N/A 09/15/2015   Procedure: REPAIR VAGINAL CUFF;  Surgeon: Elenora Fender Ward, MD;  Location: [facility] OR[facility];  Service: Gynecology;  Laterality: N/A;  . WISDOM TOOTH EXTRACTION      SOCIAL HISTORY:  Social History   Tobacco Use  . Smoking status: Current Every Day Smoker    Packs/day: 1.00    Years: 13.00    Pack years: 13.00    Types: Cigarettes  . Smokeless tobacco: Never Used  Substance Use Topics  . Alcohol use: Yes    Comment: occ    FAMILY HISTORY: No family history on file.  DRUG ALLERGIES: No Known Allergies  REVIEW OF SYSTEMS:   CONSTITUTIONAL: No fever, has fatigue and weakness.  EYES: No blurred or double vision.  EARS, NOSE, AND THROAT: No tinnitus or ear pain.  RESPIRATORY: No cough, shortness of breath, wheezing or hemoptysis.  CARDIOVASCULAR: No chest pain, orthopnea, edema.  GASTROINTESTINAL: Has nausea, vomiting,  abdominal pain.  No diarrhea GENITOURINARY: No dysuria, hematuria.  ENDOCRINE: No polyuria, nocturia,  HEMATOLOGY: No anemia, easy bruising or bleeding SKIN: No rash or lesion. MUSCULOSKELETAL: No joint pain or arthritis.   NEUROLOGIC: No tingling, numbness, weakness.  PSYCHIATRY: No anxiety or depression.   MEDICATIONS AT HOME:  Prior to Admission medications   Medication Sig Start Date End Date Taking? Authorizing Provider  ibuprofen (ADVIL,MOTRIN) 200 MG tablet Take 1,200 mg by mouth every 6 (six) hours as needed for headache, mild  pain or moderate pain (ARTHRITS).   Yes [provider]  omeprazole (PRILOSEC OTC) 20 MG tablet Take 20 mg by mouth daily.   Yes [provider]  simethicone (MYLICON) 80 MG chewable tablet Chew 80 mg by mouth daily.   Yes [provider]  ibuprofen (ADVIL,MOTRIN) 600 MG tablet Take 1 tablet (600 mg total) by mouth every 6 (six) hours. Patient not taking: Reported on 12/30/2015 09/15/15   Ward, Elenora Fenderhelsea C, MD      PHYSICAL EXAMINATION:    VITAL SIGNS: Blood pressure 110/64, pulse 78, temperature 98.5 F (36.9 C), temperature source Oral, resp. rate 20, height 5\' 2"  (1.575 m), weight 56.7 kg (125 lb), last menstrual period 12/20/2014, SpO2 100 %.  GENERAL:  32 y.o.-year-old patient lying in the bed with no acute distress.  EYES: Pupils equal, round, reactive to light and accommodation. No scleral icterus. Extraocular muscles intact.  HEENT: Head atraumatic, normocephalic. Oropharynx dry and nasopharynx clear.  NECK:  Supple, no jugular venous distention. No thyroid enlargement, no tenderness.  LUNGS: Normal breath sounds bilaterally, no wheezing, rales,rhonchi or crepitation. No use of accessory muscles of respiration.  CARDIOVASCULAR: S1, S2 normal. No murmurs, rubs, or gallops.  ABDOMEN: Soft, tenderness in epigastrium, nondistended. Bowel sounds present. No organomegaly or mass.  EXTREMITIES: No pedal edema, cyanosis, or clubbing.  NEUROLOGIC: Cranial nerves II through XII are intact. Muscle strength 5/5 in all extremities. Sensation intact. Gait not checked.  PSYCHIATRIC: The patient is alert and oriented x 3.  SKIN: No obvious rash, lesion, or ulcer.   LABORATORY PANEL:   CBC Recent Labs  Lab 09/08/17 0736  WBC 10.7  HGB 15.0  HCT 42.8  PLT 296  MCV 96.5  MCH 33.7  MCHC 35.0  RDW 13.6  LYMPHSABS 1.6  MONOABS 0.5  EOSABS 0.4  BASOSABS 0.1   ------------------------------------------------------------------------------------------------------------------  Chemistries  Recent Labs  Lab 09/08/17 0736  NA 140  K 3.8  CL 108  CO2 23  GLUCOSE 138*  BUN 14  CREATININE 0.91  CALCIUM 9.1  AST 30  ALT 17  ALKPHOS 38  BILITOT 0.6   ------------------------------------------------------------------------------------------------------------------ estimated creatinine clearance is 69.5 mL/min (by C-G formula based on SCr of 0.91  mg/dL). ------------------------------------------------------------------------------------------------------------------ No results for input(s): TSH, T4TOTAL, T3FREE, THYROIDAB in the last 72 hours.  Invalid input(s): FREET3   Coagulation profile No results for input(s): INR, PROTIME in the last 168 hours. ------------------------------------------------------------------------------------------------------------------- No results for input(s): DDIMER in the last 72 hours. -------------------------------------------------------------------------------------------------------------------  Cardiac Enzymes No results for input(s): CKMB, TROPONINI, MYOGLOBIN in the last 168 hours.  Invalid input(s): CK ------------------------------------------------------------------------------------------------------------------ Invalid input(s): POCBNP  ---------------------------------------------------------------------------------------------------------------  Urinalysis    Component Value Date/Time   COLORURINE AMBER (A) 09/08/2017 1000   APPEARANCEUR HAZY (A) 09/08/2017 1000   APPEARANCEUR Clear 04/17/2014 1620   LABSPEC 1.029 09/08/2017 1000   LABSPEC 1.016 04/17/2014 1620   PHURINE 6.0 09/08/2017 1000   GLUCOSEU NEGATIVE 09/08/2017 1000   GLUCOSEU Negative 04/17/2014 1620   HGBUR NEGATIVE 09/08/2017 1000   BILIRUBINUR NEGATIVE 09/08/2017 1000   BILIRUBINUR Negative 04/17/2014 1620   KETONESUR 5 (A) 09/08/2017 1000   PROTEINUR 100 (A) 09/08/2017 1000   NITRITE NEGATIVE 09/08/2017 1000   LEUKOCYTESUR NEGATIVE 09/08/2017 1000   LEUKOCYTESUR Trace 04/17/2014 1620     RADIOLOGY: Koreas Abdomen Limited Ruq  Result Date: 09/08/2017 CLINICAL DATA:  RIGHT upper quadrant pain for 1 week EXAM: ULTRASOUND ABDOMEN LIMITED RIGHT UPPER QUADRANT COMPARISON:  None. FINDINGS: Gallbladder: No gallstones or wall thickening  visualized. No sonographic Murphy sign noted by sonographer. Common bile duct:  Diameter: 3 mm Liver: No focal lesion identified. Within normal limits in parenchymal echogenicity. Portal vein is patent on color Doppler imaging with normal direction of blood flow towards the liver. IMPRESSION: Normal RIGHT upper quadrant ultrasound. Electronically Signed   By: Bary Richard M.D.   On: 09/08/2017 08:58    EKG: Orders placed or performed during the hospital encounter of 04/15/17  . ED EKG within 10 minutes  . ED EKG within 10 minutes  . EKG 12-Lead  . EKG 12-Lead  . EKG    IMPRESSION AND PLAN: 32 year old female patient with history of hemochromatosis, bipolar disorder, irritable bowel syndrome presented to the emergency room with abdominal discomfort, nausea and vomiting.  -Intractable abdominal pain Could be possibly from gastritis Pain management Monitor liver function tests LFTs in the emergency room normal  -Acute gastritis Oral Protonix  -Dehydration IV fluid hydration  -Nausea and vomiting Antiemetics  -Tobacco abuse Tobacco cessation counseled to the patient for 6 minutes Nicotine patch offered   All the records are reviewed and case discussed with ED provider. Management plans discussed with the patient, family and they are in agreement.  CODE STATUS:Full code    TOTAL TIME TAKING CARE OF THIS PATIENT: 50 minutes.    Ihor Austin M.D on 09/08/2017 at 12:29 PM  Between 7am to 6pm - Pager - [phone]  After 6pm go to [email or web address] - password [email or web address] [facility]  [facility]  Office  [phone]  CC: Primary care physician; Lyndon Code, MD

## 2017-09-09 ENCOUNTER — Encounter: Payer: Self-pay | Admitting: Radiology

## 2017-09-09 ENCOUNTER — Observation Stay: Payer: Medicaid Other

## 2017-09-09 DIAGNOSIS — Z791 Long term (current) use of non-steroidal anti-inflammatories (NSAID): Secondary | ICD-10-CM

## 2017-09-09 DIAGNOSIS — K581 Irritable bowel syndrome with constipation: Secondary | ICD-10-CM | POA: Diagnosis present

## 2017-09-09 DIAGNOSIS — R1011 Right upper quadrant pain: Secondary | ICD-10-CM | POA: Diagnosis present

## 2017-09-09 DIAGNOSIS — E876 Hypokalemia: Secondary | ICD-10-CM | POA: Diagnosis present

## 2017-09-09 DIAGNOSIS — K5900 Constipation, unspecified: Secondary | ICD-10-CM

## 2017-09-09 DIAGNOSIS — F17210 Nicotine dependence, cigarettes, uncomplicated: Secondary | ICD-10-CM | POA: Diagnosis present

## 2017-09-09 DIAGNOSIS — R109 Unspecified abdominal pain: Secondary | ICD-10-CM

## 2017-09-09 DIAGNOSIS — E860 Dehydration: Secondary | ICD-10-CM | POA: Diagnosis present

## 2017-09-09 DIAGNOSIS — K828 Other specified diseases of gallbladder: Secondary | ICD-10-CM | POA: Diagnosis present

## 2017-09-09 DIAGNOSIS — G8929 Other chronic pain: Secondary | ICD-10-CM | POA: Diagnosis present

## 2017-09-09 LAB — BASIC METABOLIC PANEL
Anion gap: 7 (ref 5–15)
BUN: 10 mg/dL (ref 6–20)
CHLORIDE: 112 mmol/L — AB (ref 101–111)
CO2: 21 mmol/L — AB (ref 22–32)
Calcium: 8 mg/dL — ABNORMAL LOW (ref 8.9–10.3)
Creatinine, Ser: 0.58 mg/dL (ref 0.44–1.00)
GFR calc Af Amer: >60 mL/min (ref >60)
GFR calc non Af Amer: >60 mL/min (ref >60)
Glucose, Bld: 100 mg/dL — ABNORMAL HIGH (ref 65–99)
POTASSIUM: 3.4 mmol/L — AB (ref 3.5–5.1)
SODIUM: 140 mmol/L (ref 135–145)

## 2017-09-09 LAB — CBC
HEMATOCRIT: 38.6 % (ref 35.0–47.0)
Hemoglobin: 13.3 g/dL (ref 12.0–16.0)
MCH: 33.3 pg (ref 26.0–34.0)
MCHC: 34.5 g/dL (ref 32.0–36.0)
MCV: 96.5 fL (ref 80.0–100.0)
Platelets: 248 10*3/uL (ref 150–440)
RBC: 4 MIL/uL (ref 3.80–5.20)
RDW: 13.7 % (ref 11.5–14.5)
WBC: 7 10*3/uL (ref 3.6–11.0)

## 2017-09-09 LAB — LIPASE, BLOOD: Lipase: 29 U/L (ref 11–51)

## 2017-09-09 MED ORDER — IOPAMIDOL (ISOVUE-300) INJECTION 61%
100.0000 mL | Freq: Once | INTRAVENOUS | Status: AC | PRN
  Administered 2017-09-09: 100 mL via INTRAVENOUS

## 2017-09-09 MED ORDER — POTASSIUM CHLORIDE CRYS ER 20 MEQ PO TBCR
40.0000 meq | EXTENDED_RELEASE_TABLET | Freq: Once | ORAL | Status: AC
  Administered 2017-09-09: 40 meq via ORAL
  Filled 2017-09-09: qty 2

## 2017-09-09 MED ORDER — IOPAMIDOL (ISOVUE-300) INJECTION 61%
15.0000 mL | INTRAVENOUS | Status: AC
  Administered 2017-09-09 (×2): 15 mL via ORAL

## 2017-09-09 MED ORDER — POLYETHYLENE GLYCOL 3350 17 G PO PACK
17.0000 g | PACK | Freq: Every day | ORAL | Status: DC
  Administered 2017-09-09: 17 g via ORAL
  Filled 2017-09-09 (×2): qty 1

## 2017-09-09 MED ORDER — DIPHENHYDRAMINE HCL 25 MG PO CAPS
25.0000 mg | ORAL_CAPSULE | Freq: Three times a day (TID) | ORAL | Status: DC | PRN
  Administered 2017-09-10: 50 mg via ORAL
  Filled 2017-09-09: qty 2

## 2017-09-09 NOTE — Consult Note (Signed)
Surgical Consultation  09/09/2017  Jody Roberts is an 32 y.o. female.   Referring Physician: Dr Benjie Karvonen  CC: Epigastric pain  HPI: This a patient with 2 years of abdominal pain mostly in the epigastrium.  It is been worsening over the last week.  She states it has never gone away in 2 years.  She has nausea and recent emesis.  She came into the hospital after being discharged from a hospital in [place] when she was visiting her twin identical twin sister.  Of note is that her identical twin sister had the same sort of pain and had her gallbladder removed when it showed no signs of stones but had "a calculus".  Patient utilizes tobacco regularly.  She states that this pain comes on anytime she eats anything including white rice and water.  He has never had an endoscopy but was to have a work-up for this in the past but had no insurance.  Past Medical History:  Diagnosis Date  . Abnormal uterine bleeding   . Bipolar disorder ([place])   . Eating disorder   . Hemochromatosis   . IBS (irritable bowel syndrome)   . Palpitations    INTERMITTENT-PT DENIES SYMPTOMS OF SOB, DIZZINES ETC    Past Surgical History:  Procedure Laterality Date  . CYSTOSCOPY  01/06/2015   Procedure: CYSTOSCOPY;  Surgeon: Gae Dry, MD;  Location: [facility] OR[facility];  Service: Gynecology;;  . LAPAROSCOPIC HYSTERECTOMY Bilateral 01/06/2015   Procedure: HYSTERECTOMY TOTAL LAPAROSCOPIC/BILATERAL SALPINECTOMY;  Surgeon: Gae Dry, MD;  Location: [facility];  Service: Gynecology;  Laterality: Bilateral;  . LAPAROSCOPIC OVARIAN CYSTECTOMY Right 09/15/2015   Procedure: LAPAROSCOPIC RIGHT OOPHERECTOMY ;  Surgeon: Honor Loh Ward, MD;  Location: [facility];  Service: Gynecology;  Laterality: Right;  . REPAIR VAGINAL CUFF N/A 09/15/2015   Procedure: REPAIR VAGINAL CUFF;  Surgeon: Honor Loh Ward, MD;  Location: [facility] OR[facility];  Service: Gynecology;  Laterality: N/A;  . WISDOM TOOTH EXTRACTION      No family history on file.  Social History:   reports that she has been smoking cigarettes.  She has a 13.00 pack-year smoking history. She has never used smokeless tobacco. She reports that she drinks alcohol. She reports that she does not use drugs.  Allergies: No Known Allergies  Medications reviewed.   Review of Systems:   Review of Systems  Constitutional: Negative.   HENT: Negative.   Eyes: Negative.   Respiratory: Negative.   Cardiovascular: Negative.   Gastrointestinal: Positive for abdominal pain, constipation, heartburn, nausea and vomiting. Negative for blood in stool, diarrhea and melena.  Genitourinary: Negative.   Musculoskeletal: Negative.   Skin: Negative.   Neurological: Negative.   Endo/Heme/Allergies: Negative.   Psychiatric/Behavioral: Negative.      Physical Exam:  BP 123/71   Pulse (!) 59   Temp 97.8 F (36.6 C) (Oral)   Resp 15   Ht '5\' 2"'$  (1.575 m)   Wt 123 lb 10.9 oz (56.1 kg)   LMP 12/20/2014   SpO2 100%   BMI 22.62 kg/m   Physical Exam  Constitutional: She appears well-developed and well-nourished.  Non-toxic appearance. She does not appear ill.  Thin female patient appears uncomfortable keeps her eyes closed during the entire encounter.  HENT:  Head: Normocephalic and atraumatic.  Cardiovascular: Normal rate and regular rhythm.  Pulmonary/Chest: Effort normal. No stridor. No respiratory distress.  Abdominal: Soft. Normal appearance. She exhibits no distension, no ascites and no mass. There is tenderness in the right lower quadrant,  epigastric area and left lower quadrant. There is no rigidity, no rebound, no guarding and no CVA tenderness. Hernia confirmed negative in the ventral area.  Minimal tenderness identified mostly in the epigastrium and left lower quadrant.  Negative Murphy sign almost no tenderness in the right upper quadrant but both lower quadrants and epigastrium exhibit some tenderness.  No guarding rebound or percussion tenderness and no distention  Skin: Skin is warm and  dry.  Vitals reviewed.     Results for orders placed or performed during the hospital encounter of 09/08/17 (from the past 48 hour(s))  Basic metabolic panel     Status: Abnormal   Collection Time: 09/08/17  7:36 AM  Result Value Ref Range   Sodium 140 135 - 145 mmol/L   Potassium 3.8 3.5 - 5.1 mmol/L   Chloride 108 101 - 111 mmol/L   CO2 23 22 - 32 mmol/L   Glucose, Bld 138 (H) 65 - 99 mg/dL   BUN 14 6 - 20 mg/dL   Creatinine, Ser 0.91 0.44 - 1.00 mg/dL   Calcium 9.1 8.9 - 10.3 mg/dL   GFR calc non Af Amer >60 >60 mL/min   GFR calc Af Amer >60 >60 mL/min    Comment: (NOTE) The eGFR has been calculated using the CKD EPI equation. This calculation has not been validated in all clinical situations. eGFR's persistently <60 mL/min signify possible Chronic Kidney Disease.    Anion gap 9 5 - 15    Comment: Performed at [facility] [address] [place], [place] [address]  Hepatic function panel     Status: None   Collection Time: 09/08/17  7:36 AM  Result Value Ref Range   Total Protein 7.6 6.5 - 8.1 g/dL   Albumin 4.7 3.5 - 5.0 g/dL   AST 30 15 - 41 U/L   ALT 17 14 - 54 U/L   Alkaline Phosphatase 38 38 - 126 U/L   Total Bilirubin 0.6 0.3 - 1.2 mg/dL   Bilirubin, Direct 0.1 0.1 - 0.5 mg/dL   Indirect Bilirubin 0.5 0.3 - 0.9 mg/dL    Comment: Performed at [facility], [address] [place], [place] [address]  CBC with Differential     Status: Abnormal   Collection Time: 09/08/17  7:36 AM  Result Value Ref Range   WBC 10.7 3.6 - 11.0 K/uL   RBC 4.44 3.80 - 5.20 MIL/uL   Hemoglobin 15.0 12.0 - 16.0 g/dL   HCT 42.8 35.0 - 47.0 %   MCV 96.5 80.0 - 100.0 fL   MCH 33.7 26.0 - 34.0 pg   MCHC 35.0 32.0 - 36.0 g/dL   RDW 13.6 11.5 - 14.5 %   Platelets 296 150 - 440 K/uL   Neutrophils Relative % 75 %   Neutro Abs 8.1 (H) 1.4 - 6.5 K/uL   Lymphocytes Relative 15 %   Lymphs Abs 1.6 1.0 - 3.6 K/uL   Monocytes Relative 5 %   Monocytes Absolute 0.5 0.2 -  0.9 K/uL   Eosinophils Relative 4 %   Eosinophils Absolute 0.4 0 - 0.7 K/uL   Basophils Relative 1 %   Basophils Absolute 0.1 0 - 0.1 K/uL    Comment: Performed at [facility], [address] [place],  [address]  Urinalysis, Complete w Microscopic     Status: Abnormal   Collection Time: 09/08/17 10:00 AM  Result Value Ref Range   Color, Urine AMBER (A) YELLOW    Comment: BIOCHEMICALS  MAY BE AFFECTED BY COLOR   APPearance HAZY (A) CLEAR   Specific Gravity, Urine 1.029 1.005 - 1.030   pH 6.0 5.0 - 8.0   Glucose, UA NEGATIVE NEGATIVE mg/dL   Hgb urine dipstick NEGATIVE NEGATIVE   Bilirubin Urine NEGATIVE NEGATIVE   Ketones, ur 5 (A) NEGATIVE mg/dL   Protein, ur 100 (A) NEGATIVE mg/dL   Nitrite NEGATIVE NEGATIVE   Leukocytes, UA NEGATIVE NEGATIVE   RBC / HPF 0-5 0 - 5 RBC/hpf   WBC, UA 0-5 0 - 5 WBC/hpf   Bacteria, UA NONE SEEN NONE SEEN   Squamous Epithelial / LPF 21-50 0 - 5   Mucus PRESENT     Comment: Performed at [facility], [address] [place], [place] [address]  Pregnancy, urine     Status: None   Collection Time: 09/08/17 10:00 AM  Result Value Ref Range   Preg Test, Ur NEGATIVE NEGATIVE    Comment: Performed at [facility], [address] [place], [place] [address]  Pregnancy, urine POC     Status: None   Collection Time: 09/08/17 10:04 AM  Result Value Ref Range   Preg Test, Ur NEGATIVE NEGATIVE    Comment:        THE SENSITIVITY OF THIS METHODOLOGY IS >24 mIU/mL   Basic metabolic panel     Status: Abnormal   Collection Time: 09/09/17  4:07 AM  Result Value Ref Range   Sodium 140 135 - 145 mmol/L   Potassium 3.4 (L) 3.5 - 5.1 mmol/L   Chloride 112 (H) 101 - 111 mmol/L   CO2 21 (L) 22 - 32 mmol/L   Glucose, Bld 100 (H) 65 - 99 mg/dL   BUN 10 6 - 20 mg/dL   Creatinine, Ser 0.58 0.44 - 1.00 mg/dL   Calcium 8.0 (L) 8.9 - 10.3 mg/dL   GFR calc non Af Amer >60 >60 mL/min   GFR calc Af Amer >60 >60 mL/min    Comment:  (NOTE) The eGFR has been calculated using the CKD EPI equation. This calculation has not been validated in all clinical situations. eGFR's persistently <60 mL/min signify possible Chronic Kidney Disease.    Anion gap 7 5 - 15    Comment: Performed at [facility], [address] [place], [place] [address]  CBC     Status: None   Collection Time: 09/09/17  4:07 AM  Result Value Ref Range   WBC 7.0 3.6 - 11.0 K/uL   RBC 4.00 3.80 - 5.20 MIL/uL   Hemoglobin 13.3 12.0 - 16.0 g/dL   HCT 38.6 35.0 - 47.0 %   MCV 96.5 80.0 - 100.0 fL   MCH 33.3 26.0 - 34.0 pg   MCHC 34.5 32.0 - 36.0 g/dL   RDW 13.7 11.5 - 14.5 %   Platelets 248 150 - 440 K/uL    Comment: Performed at [facility], [address] [place], [place] [address]  Lipase, blood     Status: None   Collection Time: 09/09/17  4:07 AM  Result Value Ref Range   Lipase 29 11 - 51 U/L    Comment: Performed at [facility], [address] [place], [place] [address]   Ct Abdomen Pelvis W Contrast  Result Date: 09/09/2017 CLINICAL DATA:  Mid epigastric abdominal pain and nausea. EXAM: CT ABDOMEN AND PELVIS WITH CONTRAST TECHNIQUE: Multidetector CT imaging of the abdomen and pelvis was performed using the standard protocol following bolus administration of intravenous  contrast. CONTRAST:  172m ISOVUE-300 IOPAMIDOL (ISOVUE-300) INJECTION 61% COMPARISON:  Right upper quadrant abdominal ultrasound obtained yesterday. Abdomen and pelvis CT dated 08/17/2015. FINDINGS: Lower chest: Clear lung bases. Hepatobiliary: No focal liver abnormality is seen. No gallstones, gallbladder wall thickening, or biliary dilatation. Pancreas: Unremarkable. No pancreatic ductal dilatation or surrounding inflammatory changes. Spleen: Normal in size without focal abnormality. Adrenals/Urinary Tract: Adrenal glands are unremarkable. Kidneys are normal, without renal calculi, focal lesion, or hydronephrosis. Bladder is unremarkable.  Stomach/Bowel: Stomach is within normal limits. Appendix appears normal. No evidence of bowel wall thickening, distention, or inflammatory changes. Vascular/Lymphatic: No significant vascular findings are present. No enlarged abdominal or pelvic lymph nodes. Reproductive: Status post hysterectomy. Small left ovarian or ovarian remnant follicles. Other: Small supraumbilical hernia containing fat Musculoskeletal: Mild lumbar and lower thoracic spine degenerative changes. Mild-to-moderate diffuse posterior disc protrusion at the L3-4 level without significant canal or foraminal stenosis. IMPRESSION: No acute abnormality. Electronically Signed   By: SClaudie ReveringM.D.   On: 09/09/2017 12:19   UKoreaAbdomen Limited Ruq  Result Date: 09/08/2017 CLINICAL DATA:  RIGHT upper quadrant pain for 1 week EXAM: ULTRASOUND ABDOMEN LIMITED RIGHT UPPER QUADRANT COMPARISON:  None. FINDINGS: Gallbladder: No gallstones or wall thickening visualized. No sonographic Murphy sign noted by sonographer. Common bile duct: Diameter: 3 mm Liver: No focal lesion identified. Within normal limits in parenchymal echogenicity. Portal vein is patent on color Doppler imaging with normal direction of blood flow towards the liver. IMPRESSION: Normal RIGHT upper quadrant ultrasound. Electronically Signed   By: SFranki CabotM.D.   On: 09/08/2017 08:58    Assessment/Plan:  Ultrasound was completely negative.  She has a identical twin sister who had similar problems and had her gallbladder removed.  In this scenario I would consider biliary dyskinesia as a possible site or cause for her pain.  However prior to recommending a HIDA scan I would recommend GI consult and EGD as her pain is not in the right upper quadrant nor is her tenderness.  She will benefit from an EGD and GI evaluation as well as ultimately a CCK induced HIDA scan.  Cholecystectomy may be indicated in the future should this become more indicative of gallbladder disease.  Will  follow RFlorene Glen MD, FACS

## 2017-09-09 NOTE — Progress Notes (Signed)
[employer] - [place] at [facility]   PATIENT NAME: Jody Roberts    MR#:  [id number]  DATE OF BIRTH:  1985-06-12  SUBJECTIVE:   She continues to have abdominal pain which is midepigastric.  His twin sister is at bedside and reports she had similar symptoms and had a calculus cholecystitis.  REVIEW OF SYSTEMS:    Review of Systems  Constitutional: Negative for fever, chills weight loss HENT: Negative for ear pain, nosebleeds, congestion, facial swelling, rhinorrhea, neck pain, neck stiffness and ear discharge.   Respiratory: Negative for cough, shortness of breath, wheezing  Cardiovascular: Negative for chest pain, palpitations and leg swelling.  Gastrointestinal: Positive midepigastric abdominal pain with nausea and vomiting.   Genitourinary: Negative for dysuria, urgency, frequency, hematuria Musculoskeletal: Negative for back pain or joint pain Neurological: Negative for dizziness, seizures, syncope, focal weakness,  numbness and headaches.  Hematological: Does not bruise/bleed easily.  Psychiatric/Behavioral: Negative for hallucinations, confusion, dysphoric mood    Tolerating Diet: no      DRUG ALLERGIES:  No Known Allergies  VITALS:  Blood pressure 119/76, pulse 70, temperature 97.6 F (36.4 C), temperature source Oral, resp. rate 18, height 5\' 2"  (1.575 m), weight 56.1 kg (123 lb 10.9 oz), last menstrual period 12/20/2014, SpO2 100 %.  PHYSICAL EXAMINATION:  Constitutional: Appears well-developed and well-nourished. No distress. HENT: Normocephalic. Marland Kitchen Oropharynx is clear and moist.  Eyes: Conjunctivae and EOM are normal. PERRLA, no scleral icterus.  Neck: Normal ROM. Neck supple. No JVD. No tracheal deviation. CVS: RRR, S1/S2 +, no murmurs, no gallops, no carotid bruit.  Pulmonary: Effort and breath sounds normal, no stridor, rhonchi, wheezes, rales.  Abdominal: Tenderness mid epigastric region without rebound or guarding musculoskeletal: Normal  range of motion. No edema and no tenderness.  Neuro: Alert. CN 2-12 grossly intact. No focal deficits. Skin: Skin is warm and dry. No rash noted. Psychiatric: Normal mood and affect.      LABORATORY PANEL:   CBC Recent Labs  Lab 09/09/17 0407  WBC 7.0  HGB 13.3  HCT 38.6  PLT 248   ------------------------------------------------------------------------------------------------------------------  Chemistries  Recent Labs  Lab 09/08/17 0736 09/09/17 0407  NA 140 140  K 3.8 3.4*  CL 108 112*  CO2 23 21*  GLUCOSE 138* 100*  BUN 14 10  CREATININE 0.91 0.58  CALCIUM 9.1 8.0*  AST 30  --   ALT 17  --   ALKPHOS 38  --   BILITOT 0.6  --    ------------------------------------------------------------------------------------------------------------------  Cardiac Enzymes No results for input(s): TROPONINI in the last 168 hours. ------------------------------------------------------------------------------------------------------------------  RADIOLOGY:  US Abdomen Limited Ruq  Result Date: 09/08/2017 CLINICAL DATA:  RIGHT upper quadrant pain for 1 week EXAM: ULTRASOUND ABDOMEN LIMITED RIGHT UPPER QUADRANT COMPARISON:  None. FINDINGS: Gallbladder: No gallstones or wall thickening visualized. No sonographic Murphy sign noted by sonographer. Common bile duct: Diameter: 3 mm Liver: No focal lesion identified. Within normal limits in parenchymal echogenicity. Portal vein is patent on color Doppler imaging with normal direction of blood flow towards the liver. IMPRESSION: Normal RIGHT upper quadrant ultrasound. Electronically Signed   By: Bary Richard M.D.   On: 09/08/2017 08:58     ASSESSMENT AND PLAN:   32 year old female with bipolar disorder and irritable bowel syndrome predominately constipation who presents with midepigastric abdominal pain and nausea.  1.  Midepigastric abdominal pain with nausea and vomiting:  Order lipase to rule out pancreatitis To CT the abdomen  due to sniffing amount of tenderness  on examination Continue PPI Surgery evaluation if CT is negative patient may need HIDA scan or may need GI consult for EGD  2.  Hypokalemia from vomiting: Replace and recheck in a.m.  3  IBS predominant constipation: Add stool softeners  4.  Bipolar disorder: Patient currently not taking medications  Management plans discussed with the patient and she is in agreement.  CODE STATUS: full  TOTAL TIME TAKING CARE OF THIS PATIENT: 30 minutes.     POSSIBLE D/C 2 days, DEPENDING ON CLINICAL CONDITION.   Fallyn Munnerlyn M.D on 09/09/2017 at 11:20 AM  Between 7am to 6pm - Pager - [phone] After 6pm go to [email or web address] - password [email or web address] [facility]  [facility] [place] [facility]  Office  [phone]  CC: Primary care physician; Lyndon CodeKhan, Fozia M, MD  Note: This dictation was prepared with Dragon dictation along with smaller phrase technology. Any transcriptional errors that result from this process are unintentional.

## 2017-09-09 NOTE — Consult Note (Signed)
Jody Roberts , MD [address], [address], [place], [place], [id number] [address], [place], [place], [address] Phone: [phone]  Fax: [phone]  Consultation  Referring Provider: Dr Juliene PinaMody  Primary Care Physician:  Lyndon CodeKhan, Fozia M, MD Primary Gastroenterologist:  Dr. Markham JordanElliot        Reason for Consultation: Abdominal pain   Date of Admission:  09/08/2017 Date of Consultation:  09/09/2017         HPI:   Jody Roberts is a 32 y.o. female who has a history of chronic constipation , abdominal pain , [facility] at [facility] in 2017 by Dr Markham JordanElliot. Admitted yesterday with abdomina; pain in the right side of 2 days duration. Was admitted at a hospital in [place] and had a work up which she states was negative. RUQ USG was normal yesterday. LFT's , CBC-normal.Lipase normal .    She states that the evaluation performed at the hospital an addendum to include an ultrasound of the abdomen and some blood work denies having any recent upper endoscopy.  She takes 6 tablets of Motrin every single day for generalized body aches for an attributed autoimmune condition which she has been doing for many months.  She suffers from severe constipation and does not have a bowel movement for many days at a time.  She cannot recollect the last time she had a good bowel movement.  She says this abdominal pain is been going on for 2 weeks epigastric in nature continuous nonradiating lasts all day long worse when she eats usually 20 minutes after eating.  Wakes her up at night from her sleep.  She cannot see if he gets better after good bowel movement because she has not had a good bowel movement.  Her twin sister states she has had issues with her gallbladder.  At this point of time she continues to have pain she is in the process of undergoing a CT scan of the abdomen.  Past Medical History:  Diagnosis Date  . Abnormal uterine bleeding   . Bipolar disorder (HCC)   . Eating disorder   . Hemochromatosis   . IBS (irritable  bowel syndrome)   . Palpitations    INTERMITTENT-PT DENIES SYMPTOMS OF SOB, DIZZINES ETC    Past Surgical History:  Procedure Laterality Date  . CYSTOSCOPY  01/06/2015   Procedure: CYSTOSCOPY;  Surgeon: Nadara Mustardobert P Harris, MD;  Location: [facility] ORS;  Service: Gynecology;;  . LAPAROSCOPIC HYSTERECTOMY Bilateral 01/06/2015   Procedure: HYSTERECTOMY TOTAL LAPAROSCOPIC/BILATERAL SALPINECTOMY;  Surgeon: Nadara Mustardobert P Harris, MD;  Location: [facility] ORS;  Service: Gynecology;  Laterality: Bilateral;  . LAPAROSCOPIC OVARIAN CYSTECTOMY Right 09/15/2015   Procedure: LAPAROSCOPIC RIGHT OOPHERECTOMY ;  Surgeon: Elenora Fenderhelsea C Ward, MD;  Location: [facility] ORS;  Service: Gynecology;  Laterality: Right;  . REPAIR VAGINAL CUFF N/A 09/15/2015   Procedure: REPAIR VAGINAL CUFF;  Surgeon: Elenora Fenderhelsea C Ward, MD;  Location: [facility] ORS;  Service: Gynecology;  Laterality: N/A;  . WISDOM TOOTH EXTRACTION      Prior to Admission medications   Medication Sig Start Date End Date Taking? Authorizing Provider  ibuprofen (ADVIL,MOTRIN) 200 MG tablet Take 1,200 mg by mouth every 6 (six) hours as needed for headache, mild pain or moderate pain (ARTHRITS).   Yes [provider]  omeprazole (PRILOSEC OTC) 20 MG tablet Take 20 mg by mouth daily.   Yes [provider]  simethicone (MYLICON) 80 MG chewable tablet Chew 80 mg by mouth daily.   Yes [provider]  ibuprofen (ADVIL,MOTRIN) 600 MG tablet Take 1 tablet (600 mg total) by mouth every 6 (six) hours. Patient not taking: Reported on 12/30/2015 09/15/15   Ward, Elenora Fender, MD    No family history on file.   Social History   Tobacco Use  . Smoking status: Current Every Day Smoker    Packs/day: 1.00    Years: 13.00    Pack years: 13.00    Types: Cigarettes  . Smokeless tobacco: Never Used  Substance Use Topics  . Alcohol use: Yes    Comment: occ  . Drug use: No    Allergies as of 09/08/2017  . (No Known Allergies)    Review of Systems:    All systems reviewed  and negative except where noted in HPI.   Physical Exam:  Vital signs in last 24 hours: Temp:  [97.6 F (36.4 C)-98.5 F (36.9 C)] 97.6 F (36.4 C) (06/03 0516) Pulse Rate:  [60-99] 70 (06/03 0516) Resp:  [18-20] 18 (06/03 0516) BP: (100-134)/(64-85) 119/76 (06/03 0516) SpO2:  [94 %-100 %] 100 % (06/03 0516) Weight:  [123 lb 10.9 oz (56.1 kg)] 123 lb 10.9 oz (56.1 kg) (06/02 1329) Last BM Date: (unable to tell, chronic constipaiton issue,) General:   Pleasant, cooperative in NAD Head:  Normocephalic and atraumatic. Eyes:   No icterus.   Conjunctiva pink. PERRLA. Ears:  Normal auditory acuity. Neck:  Supple; no masses or thyroidomegaly Lungs: Respirations even and unlabored. Lungs clear to auscultation bilaterally.   No wheezes, crackles, or rhonchi.  Heart:  Regular rate and rhythm;  Without murmur, clicks, rubs or gallops Abdomen:  Soft, nondistended, nontender. Normal bowel sounds. No appreciable masses or hepatomegaly.  No rebound or guarding.  Neurologic:  Alert and oriented x3;  grossly normal neurologically. Skin:  Intact without significant lesions or rashes. Cervical Nodes:  No significant cervical adenopathy. Psych:  Alert and cooperative. Normal affect.  LAB RESULTS: Recent Labs    09/08/17 0736 09/09/17 0407  WBC 10.7 7.0  HGB 15.0 13.3  HCT 42.8 38.6  PLT 296 248   BMET Recent Labs    09/08/17 0736 09/09/17 0407  NA 140 140  K 3.8 3.4*  CL 108 112*  CO2 23 21*  GLUCOSE 138* 100*  BUN 14 10  CREATININE 0.91 0.58  CALCIUM 9.1 8.0*   LFT Recent Labs    09/08/17 0736  PROT 7.6  ALBUMIN 4.7  AST 30  ALT 17  ALKPHOS 38  BILITOT 0.6  BILIDIR 0.1  IBILI 0.5   PT/INR No results for input(s): LABPROT, INR in the last 72 hours.  STUDIES: US Abdomen Limited Ruq  Result Date: 09/08/2017 CLINICAL DATA:  RIGHT upper quadrant pain for 1 week EXAM: ULTRASOUND ABDOMEN LIMITED RIGHT UPPER QUADRANT COMPARISON:  None. FINDINGS: Gallbladder: No gallstones or  wall thickening visualized. No sonographic Murphy sign noted by sonographer. Common bile duct: Diameter: 3 mm Liver: No focal lesion identified. Within normal limits in parenchymal echogenicity. Portal vein is patent on color Doppler imaging with normal direction of blood flow towards the liver. IMPRESSION: Normal RIGHT upper quadrant ultrasound. Electronically Signed   By: Bary Richard M.D.   On: 09/08/2017 08:58      Impression / Plan:   Jody Roberts is a 32 y.o. y/o female with chronic abdominal pain worse over the last 2 months.  History of long-term use of NSAIDs.  Severe constipation.  Ultrasound of the abdomen does not show any abnormalities of the gallbladder including gallstones.  Liver function tests are normal.  High on my differential is abdominal pain secondary to severe constipation and possibly an ulcer likely gastric related to long-term use of NSAIDs.   Plan  1. F/u CT scan of the abdomen if negative next step would be an upper endoscopy and a HIDA scan.  If CT scan of the abdomen shows a lot of stool suggest colon cleanout with a gallon of GoLYTELY. 2. Stool H pylori antigen 3. PPI 4.  Stop all NSAIDs.  Thank you for involving me in the care of this patient.      LOS: 0 days   Jody Mood, MD  09/09/2017, 11:26 AM

## 2017-09-10 ENCOUNTER — Encounter: Payer: Self-pay | Admitting: *Deleted

## 2017-09-10 ENCOUNTER — Encounter
Admission: EM | Disposition: A | Discharge status: Home / Self Care | Payer: Self-pay | Source: Home / Self Care | Attending: Internal Medicine

## 2017-09-10 ENCOUNTER — Inpatient Hospital Stay: Payer: Medicaid Other | Admitting: Anesthesiology

## 2017-09-10 DIAGNOSIS — R1013 Epigastric pain: Secondary | ICD-10-CM

## 2017-09-10 HISTORY — PX: ESOPHAGOGASTRODUODENOSCOPY (EGD) WITH PROPOFOL: SHX5813

## 2017-09-10 LAB — HIV ANTIBODY (ROUTINE TESTING W REFLEX): HIV Screen 4th Generation wRfx: NONREACTIVE

## 2017-09-10 LAB — BASIC METABOLIC PANEL
Anion gap: 6 (ref 5–15)
BUN: 8 mg/dL (ref 6–20)
CALCIUM: 8.5 mg/dL — AB (ref 8.9–10.3)
CO2: 22 mmol/L (ref 22–32)
CREATININE: 0.55 mg/dL (ref 0.44–1.00)
Chloride: 111 mmol/L (ref 101–111)
GFR calc Af Amer: >60 mL/min (ref >60)
Glucose, Bld: 92 mg/dL (ref 65–99)
Potassium: 4 mmol/L (ref 3.5–5.1)
Sodium: 139 mmol/L (ref 135–145)

## 2017-09-10 SURGERY — ESOPHAGOGASTRODUODENOSCOPY (EGD) WITH PROPOFOL
Anesthesia: General

## 2017-09-10 MED ORDER — MIDAZOLAM HCL 2 MG/2ML IJ SOLN
INTRAMUSCULAR | Status: DC | PRN
  Administered 2017-09-10: 2 mg via INTRAVENOUS

## 2017-09-10 MED ORDER — LIDOCAINE HCL (CARDIAC) PF 100 MG/5ML IV SOSY
PREFILLED_SYRINGE | INTRAVENOUS | Status: DC | PRN
  Administered 2017-09-10: 50 mg via INTRAVENOUS

## 2017-09-10 MED ORDER — PROPOFOL 10 MG/ML IV BOLUS
INTRAVENOUS | Status: DC | PRN
  Administered 2017-09-10: 70 mg via INTRAVENOUS
  Administered 2017-09-10: 20 mg via INTRAVENOUS

## 2017-09-10 MED ORDER — DOCUSATE SODIUM 100 MG PO CAPS
100.0000 mg | ORAL_CAPSULE | Freq: Two times a day (BID) | ORAL | Status: DC
  Administered 2017-09-10 – 2017-09-13 (×6): 100 mg via ORAL
  Filled 2017-09-10 (×6): qty 1

## 2017-09-10 MED ORDER — MIDAZOLAM HCL 2 MG/2ML IJ SOLN
INTRAMUSCULAR | Status: AC
  Filled 2017-09-10: qty 2

## 2017-09-10 MED ORDER — SENNA 8.6 MG PO TABS
1.0000 | ORAL_TABLET | Freq: Every day | ORAL | Status: DC
  Administered 2017-09-10 – 2017-09-13 (×3): 8.6 mg via ORAL
  Filled 2017-09-10 (×3): qty 1

## 2017-09-10 NOTE — Progress Notes (Signed)
[employer] - [place] at [facility]   PATIENT NAME: Jody Roberts    MR#:  [id number]  DATE OF BIRTH:  [birth year]  SUBJECTIVE:   EGD was normal.  Patient continues to have abdominal pain  REVIEW OF SYSTEMS:    Review of Systems  Constitutional: Negative for fever, chills weight loss HENT: Negative for ear pain, nosebleeds, congestion, facial swelling, rhinorrhea, neck pain, neck stiffness and ear discharge.   Respiratory: Negative for cough, shortness of breath, wheezing  Cardiovascular: Negative for chest pain, palpitations and leg swelling.  Gastrointestinal: Positive midepigastric abdominal pain with nausea and vomiting.   Genitourinary: Negative for dysuria, urgency, frequency, hematuria Musculoskeletal: Negative for back pain or joint pain Neurological: Negative for dizziness, seizures, syncope, focal weakness,  numbness and headaches.  Hematological: Does not bruise/bleed easily.  Psychiatric/Behavioral: Negative for hallucinations, confusion, dysphoric mood    Tolerating Diet: no      DRUG ALLERGIES:  No Known Allergies  VITALS:  Blood pressure 121/68, pulse (!) 55, temperature 97.9 F (36.6 C), temperature source Oral, resp. rate 16, height 5\' 2"  (1.575 m), weight 56.1 kg (123 lb 10.9 oz), last menstrual period 12/20/2014, SpO2 100 %.  PHYSICAL EXAMINATION:  Constitutional: Appears well-developed and well-nourished. No distress. HENT: Normocephalic. Marland Kitchen. Oropharynx is clear and moist.  Eyes: Conjunctivae and EOM are normal. PERRLA, no scleral icterus.  Neck: Normal ROM. Neck supple. No JVD. No tracheal deviation. CVS: RRR, S1/S2 +, no murmurs, no gallops, no carotid bruit.  Pulmonary: Effort and breath sounds normal, no stridor, rhonchi, wheezes, rales.  Abdominal: Tenderness mid epigastric region without rebound or guarding musculoskeletal: Normal range of motion. No edema and no tenderness.  Neuro: Alert. CN 2-12 grossly intact. No focal  deficits. Skin: Skin is warm and dry. No rash noted. Psychiatric: Normal mood and affect.      LABORATORY PANEL:   CBC Recent Labs  Lab 09/09/17 0407  WBC 7.0  HGB 13.3  HCT 38.6  PLT 248   ------------------------------------------------------------------------------------------------------------------  Chemistries  Recent Labs  Lab 09/08/17 0736  09/10/17 0601  NA 140   < > 139  K 3.8   < > 4.0  CL 108   < > 111  CO2 23   < > 22  GLUCOSE 138*   < > 92  BUN 14   < > 8  CREATININE 0.91   < > 0.55  CALCIUM 9.1   < > 8.5*  AST 30  --   --   ALT 17  --   --   ALKPHOS 38  --   --   BILITOT 0.6  --   --    < > = values in this interval not displayed.   ------------------------------------------------------------------------------------------------------------------  Cardiac Enzymes No results for input(s): TROPONINI in the last 168 hours. ------------------------------------------------------------------------------------------------------------------  RADIOLOGY:  Ct Abdomen Pelvis W Contrast  Result Date: 09/09/2017 CLINICAL DATA:  Mid epigastric abdominal pain and nausea. EXAM: CT ABDOMEN AND PELVIS WITH CONTRAST TECHNIQUE: Multidetector CT imaging of the abdomen and pelvis was performed using the standard protocol following bolus administration of intravenous contrast. CONTRAST:  100mL ISOVUE-300 IOPAMIDOL (ISOVUE-300) INJECTION 61% COMPARISON:  Right upper quadrant abdominal ultrasound obtained yesterday. Abdomen and pelvis CT dated 08/17/2015. FINDINGS: Lower chest: Clear lung bases. Hepatobiliary: No focal liver abnormality is seen. No gallstones, gallbladder wall thickening, or biliary dilatation. Pancreas: Unremarkable. No pancreatic ductal dilatation or surrounding inflammatory changes. Spleen: Normal in size without focal abnormality. Adrenals/Urinary Tract: Adrenal glands are unremarkable.  Kidneys are normal, without renal calculi, focal lesion, or hydronephrosis.  Bladder is unremarkable. Stomach/Bowel: Stomach is within normal limits. Appendix appears normal. No evidence of bowel wall thickening, distention, or inflammatory changes. Vascular/Lymphatic: No significant vascular findings are present. No enlarged abdominal or pelvic lymph nodes. Reproductive: Status post hysterectomy. Small left ovarian or ovarian remnant follicles. Other: Small supraumbilical hernia containing fat Musculoskeletal: Mild lumbar and lower thoracic spine degenerative changes. Mild-to-moderate diffuse posterior disc protrusion at the L3-4 level without significant canal or foraminal stenosis. IMPRESSION: No acute abnormality. Electronically Signed   By: Beckie Salts M.D.   On: 09/09/2017 12:19     ASSESSMENT AND PLAN:   32 year old female with bipolar disorder and irritable bowel syndrome predominately constipation who presents with midepigastric abdominal pain and nausea.  1.  Midepigastric abdominal pain with nausea and vomiting: CT scan was negative for acute pathology.  Lipase level was within normal limits.  EGD was normal.  Plan for HIDA scan.   GI and surgery consultation appreciated.    2.  Hypokalemia from vomiting: Replete PRN   3  IBS predominant constipation: Add stool softeners She cannot tolerate MiraLAX. 4.  Bipolar disorder: Patient currently not taking medications  Management plans discussed with the patient and she is in agreement.  CODE STATUS: full  TOTAL TIME TAKING CARE OF THIS PATIENT: 22 minutes.     POSSIBLE D/C 1-2 days, DEPENDING ON CLINICAL CONDITION.   Cornell Gaber M.D on 09/10/2017 at 1:06 PM  Between 7am to 6pm - Pager - [phone] After 6pm go to [email or web address] - password [email or web address] [facility]  [facility] [place] [facility]  Office  [phone]  CC: Primary care physician; Lyndon Code, MD  Note: This dictation was prepared with Dragon dictation along with smaller phrase technology. Any transcriptional errors that result from this process are  unintentional.

## 2017-09-10 NOTE — Anesthesia Postprocedure Evaluation (Signed)
Anesthesia Post Note  Patient: Jody Roberts  Procedure(s) Performed: ESOPHAGOGASTRODUODENOSCOPY (EGD) WITH PROPOFOL (N/A )  Patient location during evaluation: Endoscopy Anesthesia Type: General Level of consciousness: awake and alert Pain management: pain level controlled Vital Signs Assessment: post-procedure vital signs reviewed and stable Respiratory status: spontaneous breathing, nonlabored ventilation, respiratory function stable and patient connected to nasal cannula oxygen Cardiovascular status: blood pressure returned to baseline and stable Postop Assessment: no apparent nausea or vomiting Anesthetic complications: no     Last Vitals:  Vitals:   09/10/17 1011 09/10/17 1223  BP: 112/69 121/68  Pulse: (!) 51 (!) 55  Resp: 17 16  Temp: 36.6 C 36.6 C  SpO2: 100% 100%    Last Pain:  Vitals:   09/10/17 1223  TempSrc: Oral  PainSc:                  Lenard SimmerAndrew Anquan Azzarello

## 2017-09-10 NOTE — Op Note (Signed)
[facility] Gastroenterology Patient Name: Jody Roberts Procedure Date: 09/10/2017 8:51 AM MRN: [id number] Account #: [id number] Date of Birth: 1986/03/22 Admit Type: Inpatient Age: 32 Room: [facility] ENDO ROOM 1 Gender: Female Note Status: Finalized Procedure:            Upper GI endoscopy Indications:          Epigastric abdominal pain Providers:            Wyline Mood MD, MD Referring MD:         Lyndon Code, MD (Referring MD) Medicines:            Monitored Anesthesia Care Complications:        No immediate complications. Procedure:            Pre-Anesthesia Assessment:                       - Prior to the procedure, a History and Physical was                        performed, and patient medications, allergies and                        sensitivities were reviewed. The patient's tolerance of                        previous anesthesia was reviewed.                       - The risks and benefits of the procedure and the                        sedation options and risks were discussed with the                        patient. All questions were answered and informed                        consent was obtained.                       - ASA Grade Assessment: II - A patient with mild                        systemic disease.                       After obtaining informed consent, the endoscope was                        passed under direct vision. Throughout the procedure,                        the patient's blood pressure, pulse, and oxygen                        saturations were monitored continuously. The Endoscope                        was introduced through the mouth, and advanced to the  third part of duodenum. The upper GI endoscopy was                        accomplished with ease. The patient tolerated the                        procedure well. Findings:      The examined duodenum was normal.      The esophagus was normal.      Normal  mucosa was found in the entire esophagus. This was biopsied with       a cold forceps for histology.      Normal mucosa was found in the entire examined stomach. This was       biopsied with a cold forceps for histology. The mucosa along the greater       curvature felt very firm to hard when I took the biopsies .      The cardia and gastric fundus were normal on retroflexion. Impression:           - Normal examined duodenum.                       - Normal esophagus.                       - Normal mucosa was found in the entire esophagus.                        Biopsied.                       - Normal mucosa was found in the entire stomach.                        Biopsied. Recommendation:       - Return patient to hospital ward for ongoing care.                       - Advance diet as tolerated.                       - Continue present medications.                       - Await pathology results.                       - suggest HIDA scan Procedure Code(s):    --- Professional ---                       [id number], Esophagogastroduodenoscopy, flexible, transoral;                        with biopsy, single or multiple Diagnosis Code(s):    --- Professional ---                       R10.13, Epigastric pain CPT copyright 2017 American Medical Association. All rights reserved. The codes documented in this report are preliminary and upon coder review may  be revised to meet current compliance requirements. Wyline MoodKiran Brayleigh Rybacki, MD Wyline MoodKiran Aiden Helzer MD, MD 09/10/2017 9:04:07 AM This report has been signed electronically. Number of Addenda: 0 Note Initiated On: 09/10/2017 8:51  [facility]

## 2017-09-10 NOTE — Anesthesia Preprocedure Evaluation (Signed)
Anesthesia Evaluation  Patient identified by MRN, date of birth, ID band Patient awake    Reviewed: Allergy & Precautions, H&P , NPO status , Patient's Chart, lab work & pertinent test results  History of Anesthesia Complications Negative for: history of anesthetic complications  Airway Mallampati: I  TM Distance: >3 FB Neck ROM: full    Dental  (+) Poor Dentition, Dental Advidsory Given   Pulmonary neg shortness of breath, Current Smoker,           Cardiovascular Exercise Tolerance: Good (-) angina(-) Past MI and (-) DOE negative cardio ROS       Neuro/Psych PSYCHIATRIC DISORDERS Bipolar Disorder negative neurological ROS     GI/Hepatic negative GI ROS, Neg liver ROS, neg GERD  ,  Endo/Other  negative endocrine ROS  Renal/GU negative Renal ROS  negative genitourinary   Musculoskeletal   Abdominal   Peds  Hematology negative hematology ROS (+)   Anesthesia Other Findings Past Medical History:   Bipolar disorder (HCC)                                       Abnormal uterine bleeding                                    Palpitations                                                   Comment:INTERMITTENT-PT DENIES SYMPTOMS OF SOB,               DIZZINES ETC   IBS (irritable bowel syndrome)                               Hemochromatosis                                              Eating disorder                                             Past Surgical History:   WISDOM TOOTH EXTRACTION                                       LAPAROSCOPIC HYSTERECTOMY                       Bilateral 01/06/2015      Comment:Procedure: HYSTERECTOMY TOTAL               LAPAROSCOPIC/BILATERAL SALPINECTOMY;  Surgeon:               Nadara Mustardobert P Harris, MD;  Location: [facility]S;                Service: Gynecology;  Laterality: Bilateral;   CYSTOSCOPY  01/06/2015      Comment:Procedure: CYSTOSCOPY;  Surgeon:  Nadara Mustard, MD;  Location: [facility] OR[facility];  Service:               Gynecology;;  BMI    Body Mass Index   21.21 kg/m 2      Reproductive/Obstetrics negative OB ROS                             Anesthesia Physical  Anesthesia Plan  ASA: II  Anesthesia Plan: General   Post-op Pain Management:    Induction: Intravenous  PONV Risk Score and Plan: 2 and Propofol infusion  Airway Management Planned: Nasal Cannula  Additional Equipment:   Intra-op Plan:   Post-operative Plan:   Informed Consent: I have reviewed the patients History and Physical, chart, labs and discussed the procedure including the risks, benefits and alternatives for the proposed anesthesia with the patient or authorized representative who has indicated his/her understanding and acceptance.   Dental Advisory Given  Plan Discussed with: Anesthesiologist, CRNA and Surgeon  Anesthesia Plan Comments:         Anesthesia Quick Evaluation

## 2017-09-10 NOTE — Anesthesia Post-op Follow-up Note (Signed)
Anesthesia QCDR form completed.        

## 2017-09-10 NOTE — Anesthesia Procedure Notes (Signed)
Performed by: Montoya Watkin, CRNA Pre-anesthesia Checklist: Patient identified, Emergency Drugs available, Suction available, Patient being monitored and Timeout performed Patient Re-evaluated:Patient Re-evaluated prior to induction Oxygen Delivery Method: Nasal cannula Induction Type: IV induction       

## 2017-09-10 NOTE — Progress Notes (Signed)
CC: Abdominal pain Subjective: This patient with abdominal pain in the epigastrium especially now in the right lower quadrant as well.  She states that there is not much change in severity.  She has had no further vomiting.  She is awaiting further testing.  A CT scan showed essentially normal study.  Objective: Vital signs in last 24 hours: Temp:  [96.2 F (35.7 C)-97.9 F (36.6 C)] 97.9 F (36.6 C) (06/04 1223) Pulse Rate:  [51-119] 55 (06/04 1223) Resp:  [10-22] 16 (06/04 1223) BP: (108-135)/(59-122) 121/68 (06/04 1223) SpO2:  [96 %-100 %] 100 % (06/04 1223) Last BM Date: 09/09/17  Intake/Output from previous day: 06/03 0701 - 06/04 0700 In: 1735.8 [P.O.:240; I.V.:1495.8] Out: 900 [Urine:900] Intake/Output this shift: Total I/O In: 400 [I.V.:400] Out: -   Physical exam:  Nondistended nontympanitic no other change in exam patient keeps her eyes closed through the entire exam.  Vital signs are stable no icterus no jaundice  Lab Results: CBC  Recent Labs    09/08/17 0736 09/09/17 0407  WBC 10.7 7.0  HGB 15.0 13.3  HCT 42.8 38.6  PLT 296 248   BMET Recent Labs    09/09/17 0407 09/10/17 0601  NA 140 139  K 3.4* 4.0  CL 112* 111  CO2 21* 22  GLUCOSE 100* 92  BUN 10 8  CREATININE 0.58 0.55  CALCIUM 8.0* 8.5*   PT/INR No results for input(s): LABPROT, INR in the last 72 hours. ABG No results for input(s): PHART, HCO3 in the last 72 hours.  Invalid input(s): PCO2, PO2  Studies/Results: Ct Abdomen Pelvis W Contrast  Result Date: 09/09/2017 CLINICAL DATA:  Mid epigastric abdominal pain and nausea. EXAM: CT ABDOMEN AND PELVIS WITH CONTRAST TECHNIQUE: Multidetector CT imaging of the abdomen and pelvis was performed using the standard protocol following bolus administration of intravenous contrast. CONTRAST:  100mL ISOVUE-300 IOPAMIDOL (ISOVUE-300) INJECTION 61% COMPARISON:  Right upper quadrant abdominal ultrasound obtained yesterday. Abdomen and pelvis CT dated  08/17/2015. FINDINGS: Lower chest: Clear lung bases. Hepatobiliary: No focal liver abnormality is seen. No gallstones, gallbladder wall thickening, or biliary dilatation. Pancreas: Unremarkable. No pancreatic ductal dilatation or surrounding inflammatory changes. Spleen: Normal in size without focal abnormality. Adrenals/Urinary Tract: Adrenal glands are unremarkable. Kidneys are normal, without renal calculi, focal lesion, or hydronephrosis. Bladder is unremarkable. Stomach/Bowel: Stomach is within normal limits. Appendix appears normal. No evidence of bowel wall thickening, distention, or inflammatory changes. Vascular/Lymphatic: No significant vascular findings are present. No enlarged abdominal or pelvic lymph nodes. Reproductive: Status post hysterectomy. Small left ovarian or ovarian remnant follicles. Other: Small supraumbilical hernia containing fat Musculoskeletal: Mild lumbar and lower thoracic spine degenerative changes. Mild-to-moderate diffuse posterior disc protrusion at the L3-4 level without significant canal or foraminal stenosis. IMPRESSION: No acute abnormality. Electronically Signed   By: Beckie SaltsSteven  Reid M.D.   On: 09/09/2017 12:19    Anti-infectives: Anti-infectives (From admission, onward)   None      Assessment/Plan:  CT scans reviewed.  Normal study.  Normal gallbladder.  HIDA scan is been ordered.  Will continue with work-up.  Lattie Hawichard E Jorryn Hershberger, MD, FACS  09/10/2017

## 2017-09-10 NOTE — Transfer of Care (Signed)
Immediate Anesthesia Transfer of Care Note  Patient: Jody Roberts  Procedure(s) Performed: ESOPHAGOGASTRODUODENOSCOPY (EGD) WITH PROPOFOL (N/A )  Patient Location: PACU  Anesthesia Type:General  Level of Consciousness: awake and responds to stimulation  Airway & Oxygen Therapy: Patient Spontanous Breathing and Patient connected to nasal cannula oxygen  Post-op Assessment: Report given to RN and Post -op Vital signs reviewed and stable  Post vital signs: Reviewed and stable  Last Vitals:  Vitals Value Taken Time  BP 121/73 09/10/2017  9:11 AM  Temp    Pulse 63 09/10/2017  9:11 AM  Resp 10 09/10/2017  9:11 AM  SpO2 100 % 09/10/2017  9:11 AM    Last Pain:  Vitals:   09/10/17 0900  TempSrc: Tympanic  PainSc:       Patients Stated Pain Goal: 0 (09/10/17 0813)  Complications: No apparent anesthesia complications

## 2017-09-10 NOTE — H&P (Signed)
Jody MoodKiran Meoshia Billing, MD [address], [address], [place], [place], [address] [address], [address], [place], [place], [address] Phone: [phone]  Fax: [phone]  Primary Care Physician:  Lyndon CodeKhan, Fozia M, MD   Pre-Procedure History & Physical: HPI:  Jody BambergKris S Roberts is a [age] y.o. female is here for an endoscopy    Past Medical History:  Diagnosis Date  . Abnormal uterine bleeding   . Bipolar disorder (HCC)   . Eating disorder   . Hemochromatosis   . IBS (irritable bowel syndrome)   . Palpitations    INTERMITTENT-PT DENIES SYMPTOMS OF SOB, DIZZINES ETC    Past Surgical History:  Procedure Laterality Date  . CYSTOSCOPY  01/06/2015   Procedure: CYSTOSCOPY;  Surgeon: Nadara Mustardobert P Harris, MD;  Location: [facility];  Service: Gynecology;;  . LAPAROSCOPIC HYSTERECTOMY Bilateral 01/06/2015   Procedure: HYSTERECTOMY TOTAL LAPAROSCOPIC/BILATERAL SALPINECTOMY;  Surgeon: Nadara Mustardobert P Harris, MD;  Location: [facility];  Service: Gynecology;  Laterality: Bilateral;  . LAPAROSCOPIC OVARIAN CYSTECTOMY Right 09/15/2015   Procedure: LAPAROSCOPIC RIGHT OOPHERECTOMY ;  Surgeon: Elenora Fenderhelsea C Ward, MD;  Location: [facility];  Service: Gynecology;  Laterality: Right;  . REPAIR VAGINAL CUFF N/A 09/15/2015   Procedure: REPAIR VAGINAL CUFF;  Surgeon: Elenora Fenderhelsea C Ward, MD;  Location: [facility];  Service: Gynecology;  Laterality: N/A;  . WISDOM TOOTH EXTRACTION      Prior to Admission medications   Medication Sig Start Date End Date Taking? Authorizing Provider  ibuprofen (ADVIL,MOTRIN) 200 MG tablet Take 1,200 mg by mouth every 6 (six) hours as needed for headache, mild pain or moderate pain (ARTHRITS).   Yes [provider]  omeprazole (PRILOSEC OTC) 20 MG tablet Take 20 mg by mouth daily.   Yes [provider]  simethicone (MYLICON) 80 MG chewable tablet Chew 80 mg by mouth daily.   Yes [provider]  ibuprofen (ADVIL,MOTRIN) 600 MG tablet Take 1 tablet (600 mg total) by mouth every 6 (six)  hours. Patient not taking: Reported on 12/30/2015 09/15/15   Ward, Elenora Fenderhelsea C, MD    Allergies as of 09/08/2017  . (No Known Allergies)    No family history on file.  Social History   Socioeconomic History  . Marital status: Single    Spouse name: Not on file  . Number of children: Not on file  . Years of education: Not on file  . Highest education level: Not on file  Occupational History  . Not on file  Social Needs  . Financial resource strain: Not on file  . Food insecurity:    Worry: Not on file    Inability: Not on file  . Transportation needs:    Medical: Not on file    Non-medical: Not on file  Tobacco Use  . Smoking status: Current Every Day Smoker    Packs/day: 1.00    Years: 13.00    Pack years: 13.00    Types: Cigarettes  . Smokeless tobacco: Never Used  Substance and Sexual Activity  . Alcohol use: Yes    Comment: occ  . Drug use: No  . Sexual activity: Not on file  Lifestyle  . Physical activity:    Days per week: Not on file    Minutes per session: Not on file  . Stress: Not on file  Relationships  . Social connections:    Talks on phone: Not on file    Gets together: Not on file    Attends religious service: Not on file  Active member of club or organization: Not on file    Attends meetings of clubs or organizations: Not on file    Relationship status: Not on file  . Intimate partner violence:    Fear of current or ex partner: Not on file    Emotionally abused: Not on file    Physically abused: Not on file    Forced sexual activity: Not on file  Other Topics Concern  . Not on file  Social History Narrative  . Not on file    Review of Systems: See HPI, otherwise negative ROS  Physical Exam: BP 123/78   Pulse 62   Temp (!) 96.2 F (35.7 C) (Tympanic)   Resp 18   Ht 5\' 2"  (1.575 m)   Wt 123 lb 10.9 oz (56.1 kg)   LMP 12/20/2014   SpO2 100%   BMI 22.62 kg/m  General:   Alert,  pleasant and cooperative in NAD Head:  Normocephalic  and atraumatic. Neck:  Supple; no masses or thyromegaly. Lungs:  Clear throughout to auscultation, normal respiratory effort.    Heart:  +S1, +S2, Regular rate and rhythm, No edema. Abdomen:  Soft, nontender and nondistended. Normal bowel sounds, without guarding, and without rebound.   Neurologic:  Alert and  oriented x4;  grossly normal neurologically.  Impression/Plan: Jody Roberts is here for an endoscopy  to be performed for  evaluation of abdominal pain     Risks, benefits, limitations, and alternatives regarding endoscopy have been reviewed with the patient.  Questions have been answered.  All parties agreeable.   Jody Mood, MD  09/10/2017, 8:17 AM

## 2017-09-11 ENCOUNTER — Inpatient Hospital Stay: Payer: Medicaid Other

## 2017-09-11 DIAGNOSIS — R1011 Right upper quadrant pain: Secondary | ICD-10-CM

## 2017-09-11 LAB — SURGICAL PATHOLOGY

## 2017-09-11 MED ORDER — HEPARIN SODIUM (PORCINE) 5000 UNIT/ML IJ SOLN
5000.0000 [IU] | Freq: Three times a day (TID) | INTRAMUSCULAR | Status: DC
  Administered 2017-09-11 – 2017-09-13 (×5): 5000 [IU] via SUBCUTANEOUS
  Filled 2017-09-11 (×6): qty 1

## 2017-09-11 MED ORDER — CEFAZOLIN SODIUM-DEXTROSE 2-4 GM/100ML-% IV SOLN
2.0000 g | INTRAVENOUS | Status: DC
  Filled 2017-09-11: qty 100

## 2017-09-11 MED ORDER — TECHNETIUM TC 99M MEBROFENIN IV KIT
5.3000 | PACK | Freq: Once | INTRAVENOUS | Status: AC | PRN
  Administered 2017-09-11: 5.3 via INTRAVENOUS

## 2017-09-11 NOTE — Progress Notes (Signed)
[facility]   PATIENT NAME: Jody Roberts    MR#:  [id number]  DATE OF BIRTH:  Mar 24, 1986  SUBJECTIVE:   Patient continues to have midepigastric abdominal pain. She came back from HIDA scan.  REVIEW OF SYSTEMS:    Review of Systems  Constitutional: Negative for fever, chills weight loss HENT: Negative for ear pain, nosebleeds, congestion, facial swelling, rhinorrhea, neck pain, neck stiffness and ear discharge.   Respiratory: Negative for cough, shortness of breath, wheezing  Cardiovascular: Negative for chest pain, palpitations and leg swelling.  Gastrointestinal: Positive midepigastric abdominal pain with nausea Genitourinary: Negative for dysuria, urgency, frequency, hematuria Musculoskeletal: Negative for back pain or joint pain Neurological: Negative for dizziness, seizures, syncope, focal weakness,  numbness and headaches.  Hematological: Does not bruise/bleed easily.  Psychiatric/Behavioral: Negative for hallucinations, confusion, dysphoric mood    Tolerating Diet: no      DRUG ALLERGIES:  No Known Allergies  VITALS:  Blood pressure 105/69, pulse (!) 56, temperature 98 F (36.7 C), temperature source Oral, resp. rate 20, height 5\' 2"  (1.575 m), weight 56.1 kg (123 lb 10.9 oz), last menstrual period 12/20/2014, SpO2 98 %.  PHYSICAL EXAMINATION:  Constitutional: Appears well-developed and well-nourished. No distress. HENT: Normocephalic. Marland Kitchen Oropharynx is clear and moist.  Eyes: Conjunctivae and EOM are normal. PERRLA, no scleral icterus.  Neck: Normal ROM. Neck supple. No JVD. No tracheal deviation. CVS: RRR, S1/S2 +, no murmurs, no gallops, no carotid bruit.  Pulmonary: Effort and breath sounds normal, no stridor, rhonchi, wheezes, rales.  Abdominal: Tenderness mid epigastric region without rebound or guarding musculoskeletal: Normal range of motion. No edema and no tenderness.  Neuro: Alert. CN 2-12 grossly intact. No focal  deficits. Skin: Skin is warm and dry. No rash noted. Psychiatric: Normal mood and affect.      LABORATORY PANEL:   CBC Recent Labs  Lab 09/09/17 0407  WBC 7.0  HGB 13.3  HCT 38.6  PLT 248   ------------------------------------------------------------------------------------------------------------------  Chemistries  Recent Labs  Lab 09/08/17 0736  09/10/17 0601  NA 140   < > 139  K 3.8   < > 4.0  CL 108   < > 111  CO2 23   < > 22  GLUCOSE 138*   < > 92  BUN 14   < > 8  CREATININE 0.91   < > 0.55  CALCIUM 9.1   < > 8.5*  AST 30  --   --   ALT 17  --   --   ALKPHOS 38  --   --   BILITOT 0.6  --   --    < > = values in this interval not displayed.   ------------------------------------------------------------------------------------------------------------------  Cardiac Enzymes No results for input(s): TROPONINI in the last 168 hours. ------------------------------------------------------------------------------------------------------------------  RADIOLOGY:  Nm Hepato W/eject Fract  Result Date: 09/11/2017 CLINICAL DATA:  Nausea and vomiting EXAM: NUCLEAR MEDICINE HEPATOBILIARY IMAGING WITH GALLBLADDER EF TECHNIQUE: Sequential images of the abdomen were obtained out to 60 minutes following intravenous administration of radiopharmaceutical. After oral ingestion of Ensure, gallbladder ejection fraction was determined. At 60 min, normal ejection fraction is greater than 33%. RADIOPHARMACEUTICALS:  5.3 mCi Tc-21m  Choletec IV COMPARISON:  CT from 2 days ago and ultrasound from 3 days ago FINDINGS: Prompt uptake and biliary excretion of activity by the liver is seen. Gallbladder activity is visualized, consistent with patency of cystic duct. Biliary activity passes into small bowel, consistent with patent common bile  duct. Calculated gallbladder ejection fraction is 13%. (Normal gallbladder ejection fraction with Ensure is greater than 33%.) Patient reported no symptoms  after fatty meal ingestion. IMPRESSION: Abnormal, low gallbladder ejection fraction of 13%. Electronically Signed   By: Marnee SpringJonathon  Watts M.D.   On: 09/11/2017 09:52     ASSESSMENT AND PLAN:   32 year old female with bipolar disorder and irritable bowel syndrome predominately constipation who presents with midepigastric abdominal pain and nausea.  1.  Midepigastric abdominal pain with nausea and vomiting: CT scan was negative for acute pathology.  Lipase level was within normal limits.  EGD was normal.   HIDA scan shows gallbladder ejection fraction of 13%.  Patient would like to have cholecystectomy if this is an option.  Discussed case with surgery this morning.  2.  Hypokalemia from vomiting: Replete PRN   3  IBS predominant constipation: Continue stool softeners She cannot tolerate MiraLAX. 4.  Bipolar disorder: Patient currently not taking medications  Management plans discussed with the patient and she is in agreement.  CODE STATUS: full  TOTAL TIME TAKING CARE OF THIS PATIENT: 23 minutes.   Discussed with Dr. Excell Seltzerooper  POSSIBLE D/C 2 days, DEPENDING ON CLINICAL CONDITION.   Safi Culotta M.D on 09/11/2017 at 12:52 PM  Between 7am to 6pm - Pager - [phone] After 6pm go to [email or web address] - password [email or web address] [facility]  [facility]  Office  [phone]  CC: Primary care physician; Lyndon CodeKhan, Fozia M, MD  Note: This dictation was prepared with Dragon dictation along with smaller phrase technology. Any transcriptional errors that result from this process are unintentional.

## 2017-09-11 NOTE — Progress Notes (Signed)
Patient not in room, presumably getting HIDA scan.  Results of EGD noted.  We will see later today.

## 2017-09-11 NOTE — Progress Notes (Signed)
   Jody Roberts , MD [address], [address], [place], [place], [address] [address], [place], [place], [address] Phone: [phone]  Fax: [phone]   Jody Roberts is being followed for abdominal pain  Day 2 of follow up   Subjective: nonew complaints    Objective: Vital signs in last 24 hours: Vitals:   09/10/17 1011 09/10/17 1223 09/10/17 2054 09/11/17 0530  BP: 112/69 121/68 122/74 105/69  Pulse: (!) 51 (!) 55 (!) 53 (!) 56  Resp: 17 16 19 20   Temp: 97.9 F (36.6 C) 97.9 F (36.6 C) 98.2 F (36.8 C) 98 F (36.7 C)  TempSrc: Oral Oral Oral Oral  SpO2: 100% 100% 100% 98%  Weight:      Height:       Weight change:   Intake/Output Summary (Last 24 hours) at 09/11/2017 1239 Last data filed at 09/11/2017 1032 Gross per 24 hour  Intake 953.89 ml  Output 2600 ml  Net -1646.11 ml     Exam: Heart:: Regular rate and rhythm, S1S2 present or without murmur or extra heart sounds Lungs: normal, clear to auscultation and clear to auscultation and percussion Abdomen: soft, nontender, normal bowel sounds   Lab Results: @LABTEST2 @ Micro Results: No results found for this or any previous visit (from the past 240 hour(s)). Studies/Results: Nm Hepato W/eject Fract  Result Date: 09/11/2017 CLINICAL DATA:  Nausea and vomiting EXAM: NUCLEAR MEDICINE HEPATOBILIARY IMAGING WITH GALLBLADDER EF TECHNIQUE: Sequential images of the abdomen were obtained out to 60 minutes following intravenous administration of radiopharmaceutical. After oral ingestion of Ensure, gallbladder ejection fraction was determined. At 60 min, normal ejection fraction is greater than 33%. RADIOPHARMACEUTICALS:  5.3 mCi Tc-6082m  Choletec IV COMPARISON:  CT from 2 days ago and ultrasound from 3 days ago FINDINGS: Prompt uptake and biliary excretion of activity by the liver is seen. Gallbladder activity is visualized, consistent with patency of cystic duct. Biliary activity passes into small bowel, consistent  with patent common bile duct. Calculated gallbladder ejection fraction is 13%. (Normal gallbladder ejection fraction with Ensure is greater than 33%.) Patient reported no symptoms after fatty meal ingestion. IMPRESSION: Abnormal, low gallbladder ejection fraction of 13%. Electronically Signed   By: Marnee SpringJonathon  Watts M.D.   On: 09/11/2017 09:52   Medications: I have reviewed the patient's current medications. Scheduled Meds: . docusate sodium  100 mg Oral BID  . nicotine  21 mg Transdermal Daily  . senna  1 tablet Oral Daily  . simethicone  80 mg Oral Daily   Continuous Infusions: . sodium chloride 75 mL/hr at 09/10/17 1811   PRN Meds:.acetaminophen **OR** acetaminophen, diphenhydrAMINE, HYDROcodone-acetaminophen, morphine injection, ondansetron **OR** ondansetron (ZOFRAN) IV   Assessment: Active Problems:   Abdominal pain   Jody Roberts 32 y.o. female been consulted for abdominal pain. EGD+ct scan of the abdomen negative. Biopsies of stomach awaited. HIDA scan shows EF of 15 %  Plan: 1. Would need cholecystectomy- Dr Excell Seltzerooper following   I will sign off.  Please call me if any further GI concerns or questions.  We would like to thank you for the opportunity to participate in the care of Jody Roberts.    LOS: 2 days   Jody MoodKiran Kaio Kuhlman, MD 09/11/2017, 12:39 PM

## 2017-09-11 NOTE — Progress Notes (Signed)
CC: Epigastric pain Subjective: This patient with ongoing constant epigastric pain.  She has a very complicated set of symptoms some of which point towards gallbladder disease some of which point to alternatives including peptic ulcer disease.  Work-up to date has failed to identify any problems other than the recent low ejection fraction on HIDA scan following a fatty meal.  Today the patient describes ongoing pain it is constant it is not cramping in nature she has some nausea but no emesis no fevers or chills no jaundice no acholic stools  Of significance as mentioned previously she has an identical twin sister who had the same problem and improved after having her gallbladder removed.  That was in spite of having negative studies as well.  Patient is essentially demanding cholecystectomy at this point.  Objective: Vital signs in last 24 hours: Temp:  [98 F (36.7 C)-98.3 F (36.8 C)] 98.3 F (36.8 C) (06/05 1200) Pulse Rate:  [53-59] 59 (06/05 1200) Resp:  [19-20] 19 (06/05 1200) BP: (105-122)/(68-74) 110/68 (06/05 1200) SpO2:  [98 %-100 %] 99 % (06/05 1200) Last BM Date: 09/09/17  Intake/Output from previous day: 06/04 0701 - 06/05 0700 In: 1353.9 [I.V.:1353.9] Out: 2400 [Urine:2400] Intake/Output this shift: Total I/O In: -  Out: 200 [Urine:200]  Physical exam:  Vital signs reviewed and stable patient actually opens her eyes today for this exam and there is no scleral icterus no jaundice abdomen is soft and nontender laparoscopy scars are noted.  Nontender calves  Lab Results: CBC  Recent Labs    09/09/17 0407  WBC 7.0  HGB 13.3  HCT 38.6  PLT 248   BMET Recent Labs    09/09/17 0407 09/10/17 0601  NA 140 139  K 3.4* 4.0  CL 112* 111  CO2 21* 22  GLUCOSE 100* 92  BUN 10 8  CREATININE 0.58 0.55  CALCIUM 8.0* 8.5*   PT/INR No results for input(s): LABPROT, INR in the last 72 hours. ABG No results for input(s): PHART, HCO3 in the last 72  hours.  Invalid input(s): PCO2, PO2  Studies/Results: Nm Hepato W/eject Fract  Result Date: 09/11/2017 CLINICAL DATA:  Nausea and vomiting EXAM: NUCLEAR MEDICINE HEPATOBILIARY IMAGING WITH GALLBLADDER EF TECHNIQUE: Sequential images of the abdomen were obtained out to 60 minutes following intravenous administration of radiopharmaceutical. After oral ingestion of Ensure, gallbladder ejection fraction was determined. At 60 min, normal ejection fraction is greater than 33%. RADIOPHARMACEUTICALS:  5.3 mCi Tc-43m  Choletec IV COMPARISON:  CT from 2 days ago and ultrasound from 3 days ago FINDINGS: Prompt uptake and biliary excretion of activity by the liver is seen. Gallbladder activity is visualized, consistent with patency of cystic duct. Biliary activity passes into small bowel, consistent with patent common bile duct. Calculated gallbladder ejection fraction is 13%. (Normal gallbladder ejection fraction with Ensure is greater than 33%.) Patient reported no symptoms after fatty meal ingestion. IMPRESSION: Abnormal, low gallbladder ejection fraction of 13%. Electronically Signed   By: Marnee Spring M.D.   On: 09/11/2017 09:52    Anti-infectives: Anti-infectives (From admission, onward)   None      Assessment/Plan:  This patient with complex symptomatology initially thought to be gallbladder disease.  However the patient's work-up both CT and ultrasound failed to identify stones did not identify any signs of acute cholecystitis with no gallbladder wall thickening or Murphy sign.  Her LFTs are normal as well.  The alternatives of peptic ulcer disease were entertained and Dr. Tobi Bastos has seen the patient  and performed an EGD which was completely normal as well this in spite of her taking extensive Motrin or ibuprofen without signs of ulcer or gastritis.  A HIDA scan was performed today and cholecystokinin was not available but the patient was given ensure this is not a quantitative test.  The patient  states that she was already in severe pain and when she took the ensure it may have gotten slightly worse but not really.  The ejection fraction was 13%.  I discussed at length with the patient the inaccuracies and the inability to rely on Ensure as a quantitative test for a ejection fraction and biliary dyskinesia.  Cholecystokinin has not been available at this hospital in my experience for the last year or so.  I am not sure why that is the case.  Nonetheless the accuracy of the ejection fraction of 13% these to be called into question.  However with a completely negative work-up with the exception of that 13% EF, and the patient continued to have pain I have no other options but to offer cholecystectomy to this patient.  I spoke to Dr. Tobi BastosAnna who did not believe that this was gallbladder disease and had originally thought that it was peptic ulcer disease.  After performing the EGD he still is unclear as to the etiology but agrees that there is no other option but to offer cholecystectomy in this patient he is in agreement with my plan.  I discussed with the patient the procedure itself the options of observation the risks of bleeding infection recurrence of symptoms failure to resolve any or all of her symptoms as well as risks of bowel injury bile duct injury bile duct leak any which could require further surgery including ERCP.  I also emphasized strongly that without clear-cut signs that this would make her better I cannot promise that her symptoms will improve and may not change at all.  Was discussed with her at length.  No family was present.  She wants to have her gallbladder removed and reminds me that this identical situation existed with her sister who is an identical twin.  I cannot promise the patient that this will help her but we have no other remaining diagnostic or therapeutic tools in this patient with complex symptomatology.  She understood all this and agrees with plan for laparoscopic  cholecystectomy tomorrow.  Lattie Hawichard E Wilmont Olund, MD, FACS  09/11/2017

## 2017-09-12 ENCOUNTER — Inpatient Hospital Stay: Payer: Medicaid Other | Admitting: Anesthesiology

## 2017-09-12 ENCOUNTER — Encounter: Payer: Self-pay | Admitting: Gastroenterology

## 2017-09-12 ENCOUNTER — Encounter
Admission: EM | Disposition: A | Discharge status: Home / Self Care | Payer: Self-pay | Source: Home / Self Care | Attending: Internal Medicine

## 2017-09-12 HISTORY — PX: CHOLECYSTECTOMY: SHX55

## 2017-09-12 LAB — CBC
HCT: 42.1 % (ref 35.0–47.0)
HEMOGLOBIN: 14.6 g/dL (ref 12.0–16.0)
MCH: 33.3 pg (ref 26.0–34.0)
MCHC: 34.6 g/dL (ref 32.0–36.0)
MCV: 96.2 fL (ref 80.0–100.0)
Platelets: 245 10*3/uL (ref 150–440)
RBC: 4.38 MIL/uL (ref 3.80–5.20)
RDW: 13.4 % (ref 11.5–14.5)
WBC: 5 10*3/uL (ref 3.6–11.0)

## 2017-09-12 LAB — COMPREHENSIVE METABOLIC PANEL
ALBUMIN: 3.9 g/dL (ref 3.5–5.0)
ALK PHOS: 31 U/L — AB (ref 38–126)
ALT: 16 U/L (ref 14–54)
ANION GAP: 6 (ref 5–15)
AST: 20 U/L (ref 15–41)
BILIRUBIN TOTAL: 0.8 mg/dL (ref 0.3–1.2)
BUN: 9 mg/dL (ref 6–20)
CALCIUM: 8.8 mg/dL — AB (ref 8.9–10.3)
CO2: 25 mmol/L (ref 22–32)
CREATININE: 0.76 mg/dL (ref 0.44–1.00)
Chloride: 109 mmol/L (ref 101–111)
GFR calc Af Amer: >60 mL/min (ref >60)
GFR calc non Af Amer: >60 mL/min (ref >60)
GLUCOSE: 94 mg/dL (ref 65–99)
Potassium: 4.3 mmol/L (ref 3.5–5.1)
Sodium: 140 mmol/L (ref 135–145)
Total Protein: 6.7 g/dL (ref 6.5–8.1)

## 2017-09-12 SURGERY — LAPAROSCOPIC CHOLECYSTECTOMY
Anesthesia: General | Site: Abdomen | Wound class: Contaminated

## 2017-09-12 MED ORDER — DEXAMETHASONE SODIUM PHOSPHATE 10 MG/ML IJ SOLN
INTRAMUSCULAR | Status: DC | PRN
  Administered 2017-09-12: 8 mg via INTRAVENOUS

## 2017-09-12 MED ORDER — LIDOCAINE HCL (CARDIAC) PF 100 MG/5ML IV SOSY
PREFILLED_SYRINGE | INTRAVENOUS | Status: DC | PRN
Start: 2017-09-12 — End: 2017-09-12
  Administered 2017-09-12: 100 mg via INTRAVENOUS

## 2017-09-12 MED ORDER — SUGAMMADEX SODIUM 200 MG/2ML IV SOLN
INTRAVENOUS | Status: DC | PRN
  Administered 2017-09-12: 200 mg via INTRAVENOUS

## 2017-09-12 MED ORDER — PROPOFOL 500 MG/50ML IV EMUL
INTRAVENOUS | Status: AC
  Filled 2017-09-12: qty 50

## 2017-09-12 MED ORDER — ROCURONIUM BROMIDE 100 MG/10ML IV SOLN
INTRAVENOUS | Status: DC | PRN
  Administered 2017-09-12: 50 mg via INTRAVENOUS

## 2017-09-12 MED ORDER — CEFAZOLIN SODIUM-DEXTROSE 2-3 GM-%(50ML) IV SOLR
INTRAVENOUS | Status: DC | PRN
  Administered 2017-09-12: 2 g via INTRAVENOUS

## 2017-09-12 MED ORDER — CEFAZOLIN SODIUM-DEXTROSE 2-3 GM-%(50ML) IV SOLR: INTRAVENOUS | Status: DC | PRN

## 2017-09-12 MED ORDER — FENTANYL CITRATE (PF) 100 MCG/2ML IJ SOLN
25.0000 ug | INTRAMUSCULAR | Status: AC
  Administered 2017-09-12 (×2): 25 ug via INTRAVENOUS

## 2017-09-12 MED ORDER — OXYCODONE HCL 5 MG PO TABS: 5.0000 mg | ORAL_TABLET | Freq: Once | ORAL | Status: DC | PRN

## 2017-09-12 MED ORDER — MIDAZOLAM HCL 2 MG/2ML IJ SOLN
INTRAMUSCULAR | Status: DC | PRN
  Administered 2017-09-12: 2 mg via INTRAVENOUS

## 2017-09-12 MED ORDER — FENTANYL CITRATE (PF) 100 MCG/2ML IJ SOLN
25.0000 ug | INTRAMUSCULAR | Status: DC | PRN
  Administered 2017-09-12: 50 ug via INTRAVENOUS
  Administered 2017-09-12 (×2): 25 ug via INTRAVENOUS

## 2017-09-12 MED ORDER — HYDROMORPHONE HCL 1 MG/ML IJ SOLN
INTRAMUSCULAR | Status: DC | PRN
  Administered 2017-09-12: 1 mg via INTRAVENOUS

## 2017-09-12 MED ORDER — HYDROMORPHONE HCL 1 MG/ML IJ SOLN
INTRAMUSCULAR | Status: AC
  Filled 2017-09-12: qty 1

## 2017-09-12 MED ORDER — BUPIVACAINE-EPINEPHRINE (PF) 0.25% -1:200000 IJ SOLN
INTRAMUSCULAR | Status: DC | PRN
  Administered 2017-09-12: 10 mL via PERINEURAL

## 2017-09-12 MED ORDER — SODIUM CHLORIDE FLUSH 0.9 % IV SOLN
INTRAVENOUS | Status: AC
  Filled 2017-09-12: qty 10

## 2017-09-12 MED ORDER — PROMETHAZINE HCL 25 MG/ML IJ SOLN: 6.2500 mg | INTRAMUSCULAR | Status: DC | PRN

## 2017-09-12 MED ORDER — PROPOFOL 10 MG/ML IV BOLUS
INTRAVENOUS | Status: DC | PRN
  Administered 2017-09-12: 200 mg via INTRAVENOUS

## 2017-09-12 MED ORDER — OXYCODONE HCL 5 MG/5ML PO SOLN: 5.0000 mg | Freq: Once | ORAL | Status: DC | PRN

## 2017-09-12 MED ORDER — PROMETHAZINE HCL 25 MG/ML IJ SOLN
6.2500 mg | Freq: Once | INTRAMUSCULAR | Status: AC
  Administered 2017-09-12: 6.25 mg via INTRAVENOUS

## 2017-09-12 MED ORDER — KETOROLAC TROMETHAMINE 30 MG/ML IJ SOLN
INTRAMUSCULAR | Status: DC | PRN
  Administered 2017-09-12: 30 mg via INTRAVENOUS

## 2017-09-12 MED ORDER — DEXMEDETOMIDINE HCL 200 MCG/2ML IV SOLN
INTRAVENOUS | Status: DC | PRN
  Administered 2017-09-12 (×2): 12 ug via INTRAVENOUS
  Administered 2017-09-12: 10 ug via INTRAVENOUS

## 2017-09-12 MED ORDER — FENTANYL CITRATE (PF) 100 MCG/2ML IJ SOLN
INTRAMUSCULAR | Status: AC
  Administered 2017-09-12: 25 ug via INTRAVENOUS
  Filled 2017-09-12: qty 2

## 2017-09-12 MED ORDER — LACTATED RINGERS IV SOLN
INTRAVENOUS | Status: DC
  Administered 2017-09-12: 12:00:00 via INTRAVENOUS

## 2017-09-12 MED ORDER — CEFAZOLIN SODIUM-DEXTROSE 2-4 GM/100ML-% IV SOLN
INTRAVENOUS | Status: AC
  Filled 2017-09-12: qty 100

## 2017-09-12 MED ORDER — FENTANYL CITRATE (PF) 100 MCG/2ML IJ SOLN
INTRAMUSCULAR | Status: DC | PRN
  Administered 2017-09-12: 100 ug via INTRAVENOUS

## 2017-09-12 MED ORDER — PROMETHAZINE HCL 25 MG/ML IJ SOLN
INTRAMUSCULAR | Status: AC
  Administered 2017-09-12: 6.25 mg via INTRAVENOUS
  Filled 2017-09-12: qty 1

## 2017-09-12 SURGICAL SUPPLY — 46 items
ADH LQ OCL WTPRF AMP STRL LF (MISCELLANEOUS) ×1
ADHESIVE MASTISOL STRL (MISCELLANEOUS) ×3 IMPLANT
APPLIER CLIP ROT 10 11.4 M/L (STAPLE) ×3
APR CLP MED LRG 11.4X10 (STAPLE) ×1
BAG SPEC RTRVL LRG 6X4 10 (ENDOMECHANICALS) ×1
BLADE SURG SZ11 CARB STEEL (BLADE) ×3 IMPLANT
CANISTER SUCT 1200ML W/VALVE (MISCELLANEOUS) ×3 IMPLANT
CATH CHOLANGI 4FR 420404F (CATHETERS) IMPLANT
CHLORAPREP W/TINT 26ML (MISCELLANEOUS) ×3 IMPLANT
CLIP APPLIE ROT 10 11.4 M/L (STAPLE) ×1 IMPLANT
CLOSURE WOUND 1/2 X4 (GAUZE/BANDAGES/DRESSINGS) ×1
CONRAY 60ML FOR OR (MISCELLANEOUS) IMPLANT
DRAPE C-ARM XRAY 36X54 (DRAPES) IMPLANT
ELECT REM PT RETURN 9FT ADLT (ELECTROSURGICAL) ×3
ELECTRODE REM PT RTRN 9FT ADLT (ELECTROSURGICAL) ×1 IMPLANT
GLOVE BIO SURGEON STRL SZ8 (GLOVE) ×3 IMPLANT
GOWN STRL REUS W/ TWL LRG LVL3 (GOWN DISPOSABLE) ×4 IMPLANT
GOWN STRL REUS W/TWL LRG LVL3 (GOWN DISPOSABLE) ×12
IRRIGATION STRYKERFLOW (MISCELLANEOUS) ×1 IMPLANT
IRRIGATOR STRYKERFLOW (MISCELLANEOUS) ×3
IV CATH ANGIO 12GX3 LT BLUE (NEEDLE) ×3 IMPLANT
IV NS 1000ML (IV SOLUTION) ×3
IV NS 1000ML BAXH (IV SOLUTION) ×1 IMPLANT
JACKSON PRATT 10 (INSTRUMENTS) IMPLANT
KIT TURNOVER KIT A (KITS) ×3 IMPLANT
LABEL OR SOLS (LABEL) ×3 IMPLANT
NEEDLE HYPO 22GX1.5 SAFETY (NEEDLE) ×3 IMPLANT
NEEDLE VERESS 14GA 120MM (NEEDLE) ×3 IMPLANT
NS IRRIG 500ML POUR BTL (IV SOLUTION) ×3 IMPLANT
PACK LAP CHOLECYSTECTOMY (MISCELLANEOUS) ×3 IMPLANT
POUCH SPECIMEN RETRIEVAL 10MM (ENDOMECHANICALS) ×3 IMPLANT
SCISSORS METZENBAUM CVD 33 (INSTRUMENTS) ×3 IMPLANT
SLEEVE ENDOPATH XCEL 5M (ENDOMECHANICALS) ×6 IMPLANT
SPONGE GAUZE 2X2 8PLY STER LF (GAUZE/BANDAGES/DRESSINGS) ×4
SPONGE GAUZE 2X2 8PLY STRL LF (GAUZE/BANDAGES/DRESSINGS) ×8 IMPLANT
SPONGE LAP 18X18 RF (DISPOSABLE) ×1 IMPLANT
SPONGE VERSALON 4X4 4PLY (MISCELLANEOUS) IMPLANT
STRIP CLOSURE SKIN 1/2X4 (GAUZE/BANDAGES/DRESSINGS) ×2 IMPLANT
SUT MNCRL 4-0 (SUTURE) ×3
SUT MNCRL 4-0 27XMFL (SUTURE) ×1
SUT VICRYL 0 AB UR-6 (SUTURE) ×3 IMPLANT
SUTURE MNCRL 4-0 27XMF (SUTURE) ×1 IMPLANT
SYR 20CC LL (SYRINGE) ×3 IMPLANT
TROCAR XCEL NON-BLD 11X100MML (ENDOMECHANICALS) ×3 IMPLANT
TROCAR XCEL NON-BLD 5MMX100MML (ENDOMECHANICALS) ×3 IMPLANT
TUBING INSUFFLATION (TUBING) ×3 IMPLANT

## 2017-09-12 NOTE — Progress Notes (Signed)
Received phenergan for nausea

## 2017-09-12 NOTE — Progress Notes (Signed)
[employer] [facility] [place] [facility]   PATIENT NAME: Jody Roberts    MR#:  [id number]  DATE OF BIRTH:  1985/10/12  SUBJECTIVE:   Patient is planned for cholecystectomy today REVIEW OF SYSTEMS:    Review of Systems  Constitutional: Negative for fever, chills weight loss HENT: Negative for ear pain, nosebleeds, congestion, facial swelling, rhinorrhea, neck pain, neck stiffness and ear discharge.   Respiratory: Negative for cough, shortness of breath, wheezing  Cardiovascular: Negative for chest pain, palpitations and leg swelling.  Gastrointestinal: Positive abdominal pain no nausea  genitourinary: Negative for dysuria, urgency, frequency, hematuria Musculoskeletal: Negative for back pain or joint pain Neurological: Negative for dizziness, seizures, syncope, focal weakness,  numbness and headaches.  Hematological: Does not bruise/bleed easily.  Psychiatric/Behavioral: Negative for hallucinations, confusion, dysphoric mood    Tolerating Diet: N.p.o.     DRUG ALLERGIES:  No Known Allergies  VITALS:  Blood pressure 115/87, pulse 80, temperature 98.3 F (36.8 C), temperature source Oral, resp. rate 20, height 5\' 2"  (1.575 m), weight 56.1 kg (123 lb 10.9 oz), last menstrual period 12/20/2014, SpO2 100 %.  PHYSICAL EXAMINATION:  Constitutional: Appears well-developed and well-nourished. No distress. HENT: Normocephalic. Marland Kitchen Oropharynx is clear and moist.  Eyes: Conjunctivae and EOM are normal. PERRLA, no scleral icterus.  Neck: Normal ROM. Neck supple. No JVD. No tracheal deviation. CVS: RRR, S1/S2 +, no murmurs, no gallops, no carotid bruit.  Pulmonary: Effort and breath sounds normal, no stridor, rhonchi, wheezes, rales.  Abdominal: Tenderness mid epigastric region without rebound or guarding musculoskeletal: Normal range of motion. No edema and no tenderness.  Neuro: Alert. CN 2-12 grossly intact. No focal deficits. Skin: Skin is warm and dry. No rash  noted. Psychiatric: Normal mood and affect.      LABORATORY PANEL:   CBC Recent Labs  Lab 09/12/17 0625  WBC 5.0  HGB 14.6  HCT 42.1  PLT 245   ------------------------------------------------------------------------------------------------------------------  Chemistries  Recent Labs  Lab 09/12/17 0625  NA 140  K 4.3  CL 109  CO2 25  GLUCOSE 94  BUN 9  CREATININE 0.76  CALCIUM 8.8*  AST 20  ALT 16  ALKPHOS 31*  BILITOT 0.8   ------------------------------------------------------------------------------------------------------------------  Cardiac Enzymes No results for input(s): TROPONINI in the last 168 hours. ------------------------------------------------------------------------------------------------------------------  RADIOLOGY:  Nm Hepato W/eject Fract  Result Date: 09/11/2017 CLINICAL DATA:  Nausea and vomiting EXAM: NUCLEAR MEDICINE HEPATOBILIARY IMAGING WITH GALLBLADDER EF TECHNIQUE: Sequential images of the abdomen were obtained out to 60 minutes following intravenous administration of radiopharmaceutical. After oral ingestion of Ensure, gallbladder ejection fraction was determined. At 60 min, normal ejection fraction is greater than 33%. RADIOPHARMACEUTICALS:  5.3 mCi Tc-22m  Choletec IV COMPARISON:  CT from 2 days ago and ultrasound from 3 days ago FINDINGS: Prompt uptake and biliary excretion of activity by the liver is seen. Gallbladder activity is visualized, consistent with patency of cystic duct. Biliary activity passes into small bowel, consistent with patent common bile duct. Calculated gallbladder ejection fraction is 13%. (Normal gallbladder ejection fraction with Ensure is greater than 33%.) Patient reported no symptoms after fatty meal ingestion. IMPRESSION: Abnormal, low gallbladder ejection fraction of 13%. Electronically Signed   By: Marnee Spring M.D.   On: 09/11/2017 09:52     ASSESSMENT AND PLAN:   32 year old female with bipolar  disorder and irritable bowel syndrome predominately constipation who presents with midepigastric abdominal pain and nausea.  1.  Midepigastric abdominal pain with nausea and vomiting: CT scan  was negative for acute pathology.  Lipase level was within normal limits.  EGD was normal.   HIDA scan shows gallbladder ejection fraction of 13%.   Plan for cholecystectomy today   2.  Hypokalemia from vomiting: Replete PRN   3  IBS predominant constipation: Continue stool softeners She cannot tolerate MiraLAX. 4.  Bipolar disorder: Patient currently not taking medications  Management plans discussed with the patient and she is in agreement.  CODE STATUS: full  TOTAL TIME TAKING CARE OF THIS PATIENT: 23 minutes.   Discussed with Dr. Excell Seltzerooper  POSSIBLE D/C tomorrow, DEPENDING ON CLINICAL CONDITION.   Bastian Andreoli M.D on 09/12/2017 at 11:53 AM  Between 7am to 6pm - Pager - [phone] After 6pm go to [email or web address] - password [email or web address] [facility]  [facility]  Office  [phone]  CC: Primary care physician; Lyndon CodeKhan, Fozia M, MD  Note: This dictation was prepared with Dragon dictation along with smaller phrase technology. Any transcriptional errors that result from this process are unintentional.

## 2017-09-12 NOTE — Transfer of Care (Signed)
Immediate Anesthesia Transfer of Care Note  Patient: Jody Roberts  Procedure(s) Performed: LAPAROSCOPIC CHOLECYSTECTOMY (N/A Abdomen)  Patient Location: PACU  Anesthesia Type:General  Level of Consciousness: awake, alert , oriented and patient cooperative  Airway & Oxygen Therapy: Patient Spontanous Breathing  Post-op Assessment: Report given to RN, Post -op Vital signs reviewed and stable and Patient moving all extremities X 4  Post vital signs: Reviewed and stable  Last Vitals:  Vitals Value Taken Time  BP 128/91 09/12/2017  1:35 PM  Temp    Pulse 60 09/12/2017  1:43 PM  Resp 16 09/12/2017  1:43 PM  SpO2 99 % 09/12/2017  1:43 PM  Vitals shown include unvalidated device data.  Last Pain:  Vitals:   09/12/17 1213  TempSrc: Tympanic  PainSc: 0-No pain      Patients Stated Pain Goal: 0 (09/12/17 0953)  Complications: No apparent anesthesia complications

## 2017-09-12 NOTE — Anesthesia Preprocedure Evaluation (Signed)
Anesthesia Evaluation  Patient identified by MRN, date of birth, ID band Patient awake    Reviewed: Allergy & Precautions, H&P , NPO status , Patient's Chart, lab work & pertinent test results  History of Anesthesia Complications Negative for: history of anesthetic complications  Airway Mallampati: I  TM Distance: >3 FB Neck ROM: full    Dental  (+) Chipped, Poor Dentition   Pulmonary neg shortness of breath, Current Smoker,           Cardiovascular Exercise Tolerance: Good (-) angina(-) Past MI and (-) DOE + dysrhythmias      Neuro/Psych PSYCHIATRIC DISORDERS Bipolar Disorder negative neurological ROS     GI/Hepatic negative GI ROS, Neg liver ROS,   Endo/Other  negative endocrine ROS  Renal/GU      Musculoskeletal   Abdominal   Peds  Hematology negative hematology ROS (+)   Anesthesia Other Findings Past Medical History: No date: Abnormal uterine bleeding No date: Bipolar disorder (HCC) No date: Eating disorder No date: Hemochromatosis No date: IBS (irritable bowel syndrome) No date: Palpitations     Comment:  INTERMITTENT-PT DENIES SYMPTOMS OF SOB, DIZZINES ETC  Past Surgical History: 01/06/2015: CYSTOSCOPY     Comment:  Procedure: CYSTOSCOPY;  Surgeon: Nadara Mustardobert P Harris, MD;                Location: [facility] ORS;  Service: Gynecology;; 09/10/2017: ESOPHAGOGASTRODUODENOSCOPY (EGD) WITH PROPOFOL; N/A     Comment:  Procedure: ESOPHAGOGASTRODUODENOSCOPY (EGD) WITH               PROPOFOL;  Surgeon: Wyline MoodAnna, Kiran, MD;  [facility]               [facility];  Service: Gastroenterology;  Laterality: N/A; 01/06/2015: LAPAROSCOPIC HYSTERECTOMY; Bilateral     Comment:  Procedure: HYSTERECTOMY TOTAL LAPAROSCOPIC/BILATERAL               SALPINECTOMY;  Surgeon: Nadara Mustardobert P Harris, MD;  Location:               [facility] ORS;  Service: Gynecology;  Laterality: Bilateral; 09/15/2015: LAPAROSCOPIC OVARIAN CYSTECTOMY; Right     Comment:   Procedure: LAPAROSCOPIC RIGHT OOPHERECTOMY ;  Surgeon:               Elenora Fenderhelsea C Ward, MD;  Location: [facility] ORS;  Service:               Gynecology;  Laterality: Right; 09/15/2015: REPAIR VAGINAL CUFF; N/A     Comment:  Procedure: REPAIR VAGINAL CUFF;  Surgeon: Elenora Fenderhelsea C               Ward, MD;  Location: [facility] ORS;  Service: Gynecology;                Laterality: N/A; No date: WISDOM TOOTH EXTRACTION  BMI    Body Mass Index:  22.62 kg/m      Reproductive/Obstetrics negative OB ROS                             Anesthesia Physical Anesthesia Plan  ASA: III  Anesthesia Plan: General ETT   Post-op Pain Management:    Induction: Intravenous  PONV Risk Score and Plan: Ondansetron, Dexamethasone and Midazolam  Airway Management Planned: Oral ETT  Additional Equipment:   Intra-op Plan:   Post-operative Plan: Extubation in OR  Informed Consent: I have reviewed the patients History and Physical, chart, labs and discussed the procedure including the  risks, benefits and alternatives for the proposed anesthesia with the patient or authorized representative who has indicated his/her understanding and acceptance.   Dental Advisory Given  Plan Discussed with: Anesthesiologist, CRNA and Surgeon  Anesthesia Plan Comments: (Patient consented for risks of anesthesia including but not limited to:  - adverse reactions to medications - damage to teeth, lips or other oral mucosa - sore throat or hoarseness - Damage to heart, brain, lungs or loss of life  Patient voiced understanding.)        Anesthesia Quick Evaluation

## 2017-09-12 NOTE — Addendum Note (Signed)
Addendum  created 09/12/17 1554 by Stormy Fabianurtis, Darnette Lampron, CRNA   Charge Capture section accepted

## 2017-09-12 NOTE — Progress Notes (Signed)
Patient ready for surgery she is requesting cholecystectomy.  I discussed with Dr. Tobi BastosAnna my recommendations.  He was in full agreement.  I reviewed again with the patient the potential for this not improving her symptoms at all but that there are no other alternatives at this time.  We discussed the risks of bleeding infection inability to resolve all of her symptoms and recurrence of symptoms as well as bile duct damage or leak she had no further questions and agrees to proceed this afternoon.

## 2017-09-12 NOTE — Anesthesia Post-op Follow-up Note (Signed)
Anesthesia QCDR form completed.        

## 2017-09-12 NOTE — Progress Notes (Signed)
Heart rate down to 42 but blood pressure good pt stable

## 2017-09-12 NOTE — Anesthesia Postprocedure Evaluation (Signed)
Anesthesia Post Note  Patient: Jody Roberts  Procedure(s) Performed: LAPAROSCOPIC CHOLECYSTECTOMY (N/A Abdomen)  Patient location during evaluation: PACU Anesthesia Type: General Level of consciousness: awake and alert Pain management: pain level controlled Vital Signs Assessment: post-procedure vital signs reviewed and stable Respiratory status: spontaneous breathing, nonlabored ventilation, respiratory function stable and patient connected to nasal cannula oxygen Cardiovascular status: blood pressure returned to baseline and stable Postop Assessment: no apparent nausea or vomiting Anesthetic complications: no     Last Vitals:  Vitals:   09/12/17 1430 09/12/17 1437  BP:  103/60  Pulse: (!) 44 78  Resp: 12 12  Temp:    SpO2: 100% 100%    Last Pain:  Vitals:   09/12/17 1437  TempSrc:   PainSc: 3                  Cleda MccreedyJoseph K Elnathan Fulford

## 2017-09-12 NOTE — Op Note (Signed)
Laparoscopic Cholecystectomy  Pre-operative Diagnosis: Biliary dyskinesia  Post-operative Diagnosis: Biliary dyskinesia  Procedure: Upper scopic cholecystectomy  Surgeon: Adah Salvageichard E. Excell Seltzerooper, MD FACS  Anesthesia: Gen. with endotracheal tube  Assistant: Surgical tech  Procedure Details  The patient was seen again in the Holding Room. The benefits, complications, treatment options, and expected outcomes were discussed with the patient. The risks of bleeding, infection, recurrence of symptoms, failure to resolve symptoms, bile duct damage, bile duct leak, retained common bile duct stone, bowel injury, any of which could require further surgery and/or ERCP, stent, or papillotomy were reviewed with the patient. The likelihood of improving the patient's symptoms with return to their baseline status is good.  We discussed multiple times and emphasized the fact that without clear-cut findings of gallbladder disease that this procedure may not make her symptoms completely resolved.  She has a low ejection fraction but did not have much reproduction of her pain with a fatty meal.  It is unclear to the surgical and GI team whether or not surgery will make this patient better but there are no other options at this time.  This was that reemphasized for her she understood and agreed with this plan.  The patient and/or family concurred with the proposed plan, giving informed consent.  The patient was taken to Operating Room, identified as Jody BambergKris S Veazey and the procedure verified as Laparoscopic Cholecystectomy.  A Time Out was held and the above information confirmed.  Prior to the induction of general anesthesia, antibiotic prophylaxis was administered. VTE prophylaxis was in place. General endotracheal anesthesia was then administered and tolerated well. After the induction, the abdomen was prepped with Chloraprep and draped in the sterile fashion. The patient was positioned in the supine position.  Local  anesthetic  was injected into the skin near the umbilicus and an incision made. The Veress needle was placed. Pneumoperitoneum was then created with CO2 and tolerated well without any adverse changes in the patient's vital signs. A 5mm port was placed in the periumbilical position and the abdominal cavity was explored.  Two 5-mm ports were placed in the right upper quadrant and a 12 mm epigastric port was placed all under direct vision. All skin incisions  were infiltrated with a local anesthetic agent before making the incision and placing the trocars.   The patient was positioned  in reverse Trendelenburg, tilted slightly to the patient's left.  The gallbladder was identified, the fundus grasped and retracted cephalad. Adhesions were lysed bluntly. The infundibulum was grasped and retracted laterally, exposing the peritoneum overlying the triangle of Calot. This was then divided and exposed in a blunt fashion. A critical view of the cystic duct and cystic artery was obtained.  The cystic duct was clearly identified and bluntly dissected.   The cystic artery was doubly clipped and divided.  The cystic lymphatics were doubly clipped and divided.  This allowed for good visualization of the cystic duct as it entered the infundibulum of the gallbladder.  The cystic duct was then doubly clipped and divided.  The cystic artery ran up the medial side of the gallbladder edge beneath the peritoneum and while using electrocautery it bled multiple times and required multiple additional clips along the edge of the gallbladder fossa well away from the bile ducts.  The gallbladder was taken from the gallbladder fossa in a retrograde fashion with the electrocautery. The gallbladder was removed and placed in an Endocatch bag. The liver bed was irrigated and inspected. Hemostasis was achieved with the  electrocautery. Copious irrigation was utilized and was repeatedly aspirated until clear.  The gallbladder and Endocatch sac  were then removed through the epigastric port site.   Inspection of the right upper quadrant was performed. No bleeding, bile duct injury or leak, or bowel injury was noted. Pneumoperitoneum was released.  The epigastric port site was closed with figure-of-eight 0 Vicryl sutures. 4-0 subcuticular Monocryl was used to close the skin. Steristrips and Mastisol and sterile dressings were  applied.  The patient was then extubated and brought to the recovery room in stable condition. Sponge, lap, and needle counts were correct at closure and at the conclusion of the case.   Findings: Normal-appearing gallbladder with small cystic duct.  Pathology pending  Estimated Blood Loss: Minimal         Drains: Normal         Specimens: Gallbladder           Complications: none               Osric Klopf E. Excell Seltzer, MD, FACS

## 2017-09-12 NOTE — Anesthesia Procedure Notes (Signed)
Procedure Name: Intubation Date/Time: 09/12/2017 12:49 PM Performed by: Dionne Bucy, CRNA Pre-anesthesia Checklist: Patient identified, Patient being monitored, Timeout performed, Emergency Drugs available and Suction available Patient Re-evaluated:Patient Re-evaluated prior to induction Oxygen Delivery Method: Circle system utilized Preoxygenation: Pre-oxygenation with 100% oxygen Induction Type: IV induction Ventilation: Mask ventilation without difficulty Laryngoscope Size: Mac and 3 Grade View: Grade I Tube type: Oral Tube size: 7.0 mm Number of attempts: 1 Airway Equipment and Method: Stylet Placement Confirmation: ETT inserted through vocal cords under direct vision,  positive ETCO2 and breath sounds checked- equal and bilateral Secured at: 21 cm Tube secured with: Tape Dental Injury: Teeth and Oropharynx as per pre-operative assessment

## 2017-09-13 ENCOUNTER — Encounter: Payer: Self-pay | Admitting: Surgery

## 2017-09-13 ENCOUNTER — Other Ambulatory Visit: Payer: Self-pay

## 2017-09-13 LAB — COMPREHENSIVE METABOLIC PANEL
ALBUMIN: 3.8 g/dL (ref 3.5–5.0)
ALK PHOS: 30 U/L — AB (ref 38–126)
ALT: 23 U/L (ref 14–54)
ANION GAP: 7 (ref 5–15)
AST: 31 U/L (ref 15–41)
BUN: 8 mg/dL (ref 6–20)
CO2: 26 mmol/L (ref 22–32)
Calcium: 9.3 mg/dL (ref 8.9–10.3)
Chloride: 105 mmol/L (ref 101–111)
Creatinine, Ser: 0.55 mg/dL (ref 0.44–1.00)
GFR calc Af Amer: >60 mL/min (ref >60)
GFR calc non Af Amer: >60 mL/min (ref >60)
GLUCOSE: 111 mg/dL — AB (ref 65–99)
POTASSIUM: 3.8 mmol/L (ref 3.5–5.1)
SODIUM: 138 mmol/L (ref 135–145)
Total Bilirubin: 0.7 mg/dL (ref 0.3–1.2)
Total Protein: 6.4 g/dL — ABNORMAL LOW (ref 6.5–8.1)

## 2017-09-13 LAB — CBC WITH DIFFERENTIAL/PLATELET
BASOS ABS: 0.1 10*3/uL (ref 0–0.1)
BASOS PCT: 1 %
EOS ABS: 0 10*3/uL (ref 0–0.7)
Eosinophils Relative: 0 %
HCT: 40.4 % (ref 35.0–47.0)
HEMOGLOBIN: 13.8 g/dL (ref 12.0–16.0)
Lymphocytes Relative: 18 %
Lymphs Abs: 2.2 10*3/uL (ref 1.0–3.6)
MCH: 32.8 pg (ref 26.0–34.0)
MCHC: 34.1 g/dL (ref 32.0–36.0)
MCV: 96.3 fL (ref 80.0–100.0)
MONOS PCT: 10 %
Monocytes Absolute: 1.2 10*3/uL — ABNORMAL HIGH (ref 0.2–0.9)
NEUTROS ABS: 8.3 10*3/uL — AB (ref 1.4–6.5)
NEUTROS PCT: 71 %
Platelets: 233 10*3/uL (ref 150–440)
RBC: 4.2 MIL/uL (ref 3.80–5.20)
RDW: 13.1 % (ref 11.5–14.5)
WBC: 11.8 10*3/uL — AB (ref 3.6–11.0)

## 2017-09-13 LAB — SURGICAL PATHOLOGY

## 2017-09-13 MED ORDER — NICOTINE 21 MG/24HR TD PT24: 21.0000 mg | MEDICATED_PATCH | Freq: Every day | TRANSDERMAL | 0 refills | Status: AC

## 2017-09-13 MED ORDER — HYDROCODONE-ACETAMINOPHEN 5-325 MG PO TABS: 1.0000 | ORAL_TABLET | ORAL | 0 refills | Status: DC | PRN

## 2017-09-13 MED ORDER — ACYCLOVIR 200 MG PO CAPS: 200.0000 mg | ORAL_CAPSULE | Freq: Three times a day (TID) | ORAL | 0 refills | Status: AC

## 2017-09-13 NOTE — Progress Notes (Signed)
09/13/2017  2:24 PM  Jody Roberts to be D/C'd Home per MD order.  Discussed prescriptions and follow up appointments with the patient. Prescriptions given to patient, medication list explained in detail. Pt verbalized understanding.  Allergies as of 09/13/2017   No Known Allergies     Medication List    STOP taking these medications   omeprazole 20 MG tablet Commonly known as:  PRILOSEC OTC   simethicone 80 MG chewable tablet Commonly known as:  MYLICON     TAKE these medications   acyclovir 200 MG capsule Commonly known as:  ZOVIRAX Take 1 capsule (200 mg total) by mouth 3 (three) times daily for 10 days.   HYDROcodone-acetaminophen 5-325 MG tablet Commonly known as:  NORCO/VICODIN Take 1-2 tablets by mouth every 4 (four) hours as needed for moderate pain.   ibuprofen 200 MG tablet Commonly known as:  ADVIL,MOTRIN Take 1,200 mg by mouth every 6 (six) hours as needed for headache, mild pain or moderate pain (ARTHRITS). What changed:  Another medication with the same name was removed. Continue taking this medication, and follow the directions you see here.   nicotine 21 mg/24hr patch Commonly known as:  NICODERM CQ - dosed in mg/24 hours Place 1 patch (21 mg total) onto the skin daily.       Vitals:   09/13/17 0442 09/13/17 1217  BP: 103/65 115/70  Pulse: 72 (!) 109  Resp: 16   Temp: 97.6 F (36.4 C) 98.4 F (36.9 C)  SpO2: 98% 100%    Skin clean, dry and intact without evidence of skin break down, no evidence of skin tears noted. IV catheter discontinued intact. Site without signs and symptoms of complications. Dressing and pressure applied. Pt denies pain at this time. No complaints noted.  An After Visit Summary was printed and given to the patient. Patient escorted via WC, and D/C home via private auto.  Bradly Chrisougherty, [name] E

## 2017-09-13 NOTE — Care Management Note (Signed)
Case Management Note  Patient Details  Name: Jody Roberts MRN: [id number] Date of Birth: Jan 27, 1986   Patient to discharge today.  Patient requested prescription for Zovirax.   Cost is $9 out of pocket.  No coupon required.    Subjective/Objective:                    Action/Plan:   Expected Discharge Date:  09/13/17               Expected Discharge Plan:  Home/Self Care  In-House Referral:     Discharge planning Services  [facility], Medication Assistance  Post Acute Care Choice:    Choice offered to:     DME Arranged:    DME Agency:     HH Arranged:    HH Agency:     Status of Service:  Completed, signed off  If discussed at [employer] Meetings, dates discussed:    Additional Comments:  Chapman FitchBOWEN, Anaissa Macfadden T, RN 09/13/2017, 10:14 AM

## 2017-09-13 NOTE — Discharge Instructions (Signed)
Remove dressing in 24 hours. May shower in 24 hours. Leave paper strips in place. Resume all home medications. Follow-up with Dr. Excell Seltzerooper in 14 days.

## 2017-09-13 NOTE — Progress Notes (Signed)
1 Day Post-Op  Subjective: The patient is status post laparoscopic cholecystectomy for presumed biliary dyskinesia.  She states that her pain that has bothered her for 2 years that was present preoperatively is no longer present.  She feels well enough to eat and only has some incisional pain no nausea vomiting  Objective: Vital signs in last 24 hours: Temp:  [97.6 F (36.4 C)-98 F (36.7 C)] 97.6 F (36.4 C) (06/07 0442) Pulse Rate:  [44-131] 72 (06/07 0442) Resp:  [9-20] 16 (06/07 0442) BP: (103-136)/(52-73) 103/65 (06/07 0442) SpO2:  [96 %-100 %] 98 % (06/07 0442) Weight:  [123 lb (55.8 kg)] 123 lb (55.8 kg) (06/06 1213) Last BM Date: 09/11/17  Intake/Output from previous day: 06/06 0701 - 06/07 0700 In: 1295 [I.V.:1295] Out: 610 [Urine:600; Blood:10] Intake/Output this shift: No intake/output data recorded.  Physical exam:  Wounds are dressed.  No icterus no jaundice  Lab Results: CBC  Recent Labs    09/12/17 0625 09/13/17 0541  WBC 5.0 11.8*  HGB 14.6 13.8  HCT 42.1 40.4  PLT 245 233   BMET Recent Labs    09/12/17 0625 09/13/17 0541  NA 140 138  K 4.3 3.8  CL 109 105  CO2 25 26  GLUCOSE 94 111*  BUN 9 8  CREATININE 0.76 0.55  CALCIUM 8.8* 9.3   PT/INR No results for input(s): LABPROT, INR in the last 72 hours. ABG No results for input(s): PHART, HCO3 in the last 72 hours.  Invalid input(s): PCO2, PO2  Studies/Results: Nm Hepato W/eject Fract  Result Date: 09/11/2017 CLINICAL DATA:  Nausea and vomiting EXAM: NUCLEAR MEDICINE HEPATOBILIARY IMAGING WITH GALLBLADDER EF TECHNIQUE: Sequential images of the abdomen were obtained out to 60 minutes following intravenous administration of radiopharmaceutical. After oral ingestion of Ensure, gallbladder ejection fraction was determined. At 60 min, normal ejection fraction is greater than 33%. RADIOPHARMACEUTICALS:  5.3 mCi Tc-6152m  Choletec IV COMPARISON:  CT from 2 days ago and ultrasound from 3 days ago  FINDINGS: Prompt uptake and biliary excretion of activity by the liver is seen. Gallbladder activity is visualized, consistent with patency of cystic duct. Biliary activity passes into small bowel, consistent with patent common bile duct. Calculated gallbladder ejection fraction is 13%. (Normal gallbladder ejection fraction with Ensure is greater than 33%.) Patient reported no symptoms after fatty meal ingestion. IMPRESSION: Abnormal, low gallbladder ejection fraction of 13%. Electronically Signed   By: Marnee SpringJonathon  Watts M.D.   On: 09/11/2017 09:52    Anti-infectives: Anti-infectives (From admission, onward)   Start     Dose/Rate Route Frequency Ordered Stop   09/12/17 1213  ceFAZolin (ANCEF) 2-4 GM/100ML-% IVPB    Note to Pharmacy:  Molli KnockHallaji, Violet   : cabinet override      09/12/17 1213 09/13/17 0029   09/11/17 1350  ceFAZolin (ANCEF) IVPB 2g/100 mL premix  Status:  Discontinued     2 g 200 mL/hr over 30 Minutes Intravenous 30 min pre-op 09/11/17 1350 09/12/17 1456      Assessment/Plan: s/p Procedure(s): LAPAROSCOPIC CHOLECYSTECTOMY   Patient doing very well her pain is completely gone with the exception of now incisional pain.  Recommend discharge today and I can see her in the office in 2 weeks.  She is counseled about showering tomorrow and following up to discuss return to work.  Lattie Hawichard E Charod Slawinski, MD, FACS  09/13/2017

## 2017-09-13 NOTE — Discharge Summary (Signed)
[facility] [place] [facility]   PATIENT NAME: Jody Roberts    MR#:  [id number]  DATE OF BIRTH:  [birth year]  DATE OF ADMISSION:  09/08/2017 ADMITTING PHYSICIAN: Ihor AustinPavan Pyreddy, MD  DATE OF DISCHARGE: 09/13/2017  PRIMARY CARE PHYSICIAN: Lyndon CodeKhan, Fozia M, MD    ADMISSION DIAGNOSIS:  Right upper quadrant pain [R10.11] Right upper quadrant abdominal pain [R10.11]  DISCHARGE DIAGNOSIS:  Active Problems:   Abdominal pain   SECONDARY DIAGNOSIS:   Past Medical History:  Diagnosis Date  . Abnormal uterine bleeding   . Bipolar disorder (HCC)   . Eating disorder   . Hemochromatosis   . IBS (irritable bowel syndrome)   . Palpitations    INTERMITTENT-PT DENIES SYMPTOMS OF SOB, DIZZINES ETC    HOSPITAL COURSE:   32 year old female with bipolar disorder and irritable bowel syndrome predominately constipation who presents with midepigastric abdominal pain and nausea.  1.  Midepigastric abdominal pain with nausea and vomiting: CT scan was negative for acute pathology.  Lipase level was within normal limits.  EGD was normal.   HIDA scan shows gallbladder ejection fraction of 13%.   She is postoperative day #1 cholecystectomy.  She has no abdominal pain upon discharge.  She will follow-up with Dr. Excell Seltzerooper in 2 weeks.   2.  Hypokalemia from vomiting: This was repleted  3  IBS predominant constipation: She would benefit from outpatient follow-up with GI.    4.  Bipolar disorder: Patient currently not taking medications  5.  History of herpes: Patient would like a prescription for acyclovir at discharge.   DISCHARGE CONDITIONS AND DIET:   Stable for discharge on regular diet  CONSULTS OBTAINED:  Treatment Team:  Lattie Hawooper, Richard E, MD  DRUG ALLERGIES:  No Known Allergies  DISCHARGE MEDICATIONS:   Allergies as of 09/13/2017   No Known Allergies     Medication List    STOP taking these medications   omeprazole 20 MG tablet Commonly known as:  PRILOSEC  OTC   simethicone 80 MG chewable tablet Commonly known as:  MYLICON     TAKE these medications   acyclovir 200 MG capsule Commonly known as:  ZOVIRAX Take 1 capsule (200 mg total) by mouth 3 (three) times daily for 10 days.   ibuprofen 200 MG tablet Commonly known as:  ADVIL,MOTRIN Take 1,200 mg by mouth every 6 (six) hours as needed for headache, mild pain or moderate pain (ARTHRITS). What changed:  Another medication with the same name was removed. Continue taking this medication, and follow the directions you see here.   nicotine 21 mg/24hr patch Commonly known as:  NICODERM CQ - dosed in mg/24 hours Place 1 patch (21 mg total) onto the skin daily.         Today   CHIEF COMPLAINT:   Patient is doing much better this morning.  No nausea, vomiting.  She wants a regular diet.   VITAL SIGNS:  Blood pressure 103/65, pulse 72, temperature 97.6 F (36.4 C), temperature source Oral, resp. rate 16, height 5\' 2"  (1.575 m), weight 55.8 kg (123 lb), last menstrual period 12/20/2014, SpO2 98 %.   REVIEW OF SYSTEMS:  Review of Systems  Constitutional: Negative.  Negative for chills, fever and malaise/fatigue.  HENT: Negative.  Negative for ear discharge, ear pain, hearing loss, nosebleeds and sore throat.   Eyes: Negative.  Negative for blurred vision and pain.  Respiratory: Negative.  Negative for cough, hemoptysis, shortness of breath and wheezing.   Cardiovascular: Negative.  Negative for chest pain, palpitations and leg swelling.  Gastrointestinal: Negative.  Negative for abdominal pain, blood in stool, diarrhea, nausea and vomiting.  Genitourinary: Negative.  Negative for dysuria.  Musculoskeletal: Negative.  Negative for back pain.  Skin: Negative.   Neurological: Negative for dizziness, tremors, speech change, focal weakness, seizures and headaches.  Endo/Heme/Allergies: Negative.  Does not bruise/bleed easily.  Psychiatric/Behavioral: Negative.  Negative for  depression, hallucinations and suicidal ideas.     PHYSICAL EXAMINATION:  GENERAL:  32 y.o.-year-old patient lying in the bed with no acute distress.  NECK:  Supple, no jugular venous distention. No thyroid enlargement, no tenderness.  LUNGS: Normal breath sounds bilaterally, no wheezing, rales,rhonchi  No use of accessory muscles of respiration.  CARDIOVASCULAR: S1, S2 normal. No murmurs, rubs, or gallops.  ABDOMEN: Soft, non-tender, non-distended. Bowel sounds present. No organomegaly or mass.  Bandage over surgical stite EXTREMITIES: No pedal edema, cyanosis, or clubbing.  PSYCHIATRIC: The patient is alert and oriented x 3.  SKIN: No obvious rash, lesion, or ulcer.   DATA REVIEW:   CBC Recent Labs  Lab 09/13/17 0541  WBC 11.8*  HGB 13.8  HCT 40.4  PLT 233    Chemistries  Recent Labs  Lab 09/13/17 0541  NA 138  K 3.8  CL 105  CO2 26  GLUCOSE 111*  BUN 8  CREATININE 0.55  CALCIUM 9.3  AST 31  ALT 23  ALKPHOS 30*  BILITOT 0.7    Cardiac Enzymes No results for input(s): TROPONINI in the last 168 hours.  Microbiology Results  @MICRORSLT48 @  RADIOLOGY:  Nm Hepato W/eject Fract  Result Date: 09/11/2017 CLINICAL DATA:  Nausea and vomiting EXAM: NUCLEAR MEDICINE HEPATOBILIARY IMAGING WITH GALLBLADDER EF TECHNIQUE: Sequential images of the abdomen were obtained out to 60 minutes following intravenous administration of radiopharmaceutical. After oral ingestion of Ensure, gallbladder ejection fraction was determined. At 60 min, normal ejection fraction is greater than 33%. RADIOPHARMACEUTICALS:  5.3 mCi Tc-64m  Choletec IV COMPARISON:  CT from 2 days ago and ultrasound from 3 days ago FINDINGS: Prompt uptake and biliary excretion of activity by the liver is seen. Gallbladder activity is visualized, consistent with patency of cystic duct. Biliary activity passes into small bowel, consistent with patent common bile duct. Calculated gallbladder ejection fraction is 13%.  (Normal gallbladder ejection fraction with Ensure is greater than 33%.) Patient reported no symptoms after fatty meal ingestion. IMPRESSION: Abnormal, low gallbladder ejection fraction of 13%. Electronically Signed   By: Marnee Spring M.D.   On: 09/11/2017 09:52      Allergies as of 09/13/2017   No Known Allergies     Medication List    STOP taking these medications   omeprazole 20 MG tablet Commonly known as:  PRILOSEC OTC   simethicone 80 MG chewable tablet Commonly known as:  MYLICON     TAKE these medications   acyclovir 200 MG capsule Commonly known as:  ZOVIRAX Take 1 capsule (200 mg total) by mouth 3 (three) times daily for 10 days.   ibuprofen 200 MG tablet Commonly known as:  ADVIL,MOTRIN Take 1,200 mg by mouth every 6 (six) hours as needed for headache, mild pain or moderate pain (ARTHRITS). What changed:  Another medication with the same name was removed. Continue taking this medication, and follow the directions you see here.   nicotine 21 mg/24hr patch Commonly known as:  NICODERM CQ - dosed in mg/24 hours Place 1 patch (21 mg total) onto the skin daily.  Management plans discussed with the patient and she is in agreement. Stable for discharge home  Patient should follow up with dr cooper  CODE STATUS:     Code Status Orders  (From admission, onward)        Start     Ordered   09/08/17 1329  Full code  Continuous     09/08/17 1328    Code Status History    This patient has a current code status but no historical code status.      TOTAL TIME TAKING CARE OF THIS PATIENT: 38 minutes.    Note: This dictation was prepared with Dragon dictation along with smaller phrase technology. Any transcriptional errors that result from this process are unintentional.  Catrena Vari M.D on 09/13/2017 at 8:40 AM  Between 7am to 6pm - Pager - [phone] After 6pm go to [email or web address] - password [email or web address]  [facility]  Office   [phone]  CC: Primary care physician; Lyndon Code, MD

## 2017-09-26 ENCOUNTER — Other Ambulatory Visit: Payer: Self-pay

## 2017-09-30 ENCOUNTER — Encounter: Payer: Self-pay | Admitting: Surgery

## 2017-09-30 ENCOUNTER — Ambulatory Visit (INDEPENDENT_AMBULATORY_CARE_PROVIDER_SITE_OTHER): Payer: Self-pay | Admitting: Surgery

## 2017-09-30 VITALS — BP 141/93 | HR 106 | Temp 98.7°F | Ht 63.0 in | Wt 122.0 lb

## 2017-09-30 DIAGNOSIS — K828 Other specified diseases of gallbladder: Secondary | ICD-10-CM

## 2017-09-30 NOTE — Patient Instructions (Signed)
GENERAL POST-OPERATIVE PATIENT INSTRUCTIONS   WOUND CARE INSTRUCTIONS:  Keep a dry clean dressing on the wound if there is drainage. The initial bandage may be removed after 24 hours.  Once the wound has quit draining you may leave it open to air.  If clothing rubs against the wound or causes irritation and the wound is not draining you may cover it with a dry dressing during the daytime.  Try to keep the wound dry and avoid ointments on the wound unless directed to do so.  If the wound becomes bright red and painful or starts to drain infected material that is not clear, please contact your physician immediately.  If the wound is mildly pink and has a thick firm ridge underneath it, this is normal, and is referred to as a healing ridge.  This will resolve over the next 4-6 weeks.  BATHING: You may shower if you have been informed of this by your surgeon. However, Please do not submerge in a tub, hot tub, or pool until incisions are completely sealed or have been told by your surgeon that you may do so.  DIET:  You may eat any foods that you can tolerate.  It is a good idea to eat a high fiber diet and take in plenty of fluids to prevent constipation.  If you do become constipated you may want to take a mild laxative or take ducolax tablets on a daily basis until your bowel habits are regular.  Constipation can be very uncomfortable, along with straining, after recent surgery.  ACTIVITY:  You are encouraged to cough and deep breath or use your incentive spirometer if you were given one, every 15-30 minutes when awake.  This will help prevent respiratory complications and low grade fevers post-operatively if you had a general anesthetic.  You may want to hug a pillow when coughing and sneezing to add additional support to the surgical area, if you had abdominal or chest surgery, which will decrease pain during these times.  You are encouraged to walk and engage in light activity for the next two weeks.  You  should not lift more than 20 pounds, until 10/10/2017 as it could put you at increased risk for complications.  Twenty pounds is roughly equivalent to a plastic bag of groceries. At that time- Listen to your body when lifting, if you have pain when lifting, stop and then try again in a few days. Soreness after doing exercises or activities of daily living is normal as you get back in to your normal routine.  MEDICATIONS:  Try to take narcotic medications and anti-inflammatory medications, such as tylenol, ibuprofen, naprosyn, etc., with food.  This will minimize stomach upset from the medication.  Should you develop nausea and vomiting from the pain medication, or develop a rash, please discontinue the medication and contact your physician.  You should not drive, make important decisions, or operate machinery when taking narcotic pain medication.  SUNBLOCK Use sun block to incision area over the next year if this area will be exposed to sun. This helps decrease scarring and will allow you avoid a permanent darkened area over your incision.  QUESTIONS:  Please feel free to call our office if you have any questions, and we will be glad to assist you. [phone]    

## 2017-09-30 NOTE — Progress Notes (Signed)
Outpatient postop visit  09/30/2017  Jody BambergKris S Veazey is an 32 y.o. female.    Procedure: Laparoscopic cholecystectomy  CC:  HPI: This patient underwent a laparoscopic cholecystectomy for what was believed to be biliary dyskinesia.  In the immediate postoperative period her pain that had been present for years was completely gone.  Of note is that her pathology report demonstrated a completely normal gallbladder.  Also of note is that she has a twin sister who had very similar symptoms and similar work-up with  negative gallbladder studies who underwent laparoscopic cholecystectomy and had complete resolution as well. The patient's pain that she had been experiencing for 2 years prior to surgery is completely gone.  She does have some mild nausea still.  She is afraid to eat knowing that the pain will come back but every time she eats it does not come back stating that she is fully resolved.  Denies fevers or chills no jaundice or acholic stools  Medications reviewed.    Physical Exam:  LMP 12/20/2014     PE: Thin female patient abdomen soft nontender no erythema no drainage    Assessment/Plan:  This patient status post lap scopic cholecystectomy for biliary dyskinesia.  Her pathology showed completely normal gallbladder.  Patient's pain is completely resolved in spite of the negative pathology and the negative work-up.  Just like her twin sister she has completely resolved.  I recommended no heavy lifting for another 2 weeks and follow-up on an as-needed basis.  Lattie Hawichard E Lajoyce Tamura, MD, FACS

## 2017-10-01 ENCOUNTER — Ambulatory Visit: Payer: Self-pay | Admitting: Internal Medicine

## 2019-07-02 ENCOUNTER — Ambulatory Visit: Payer: Medicaid Other | Attending: Internal Medicine

## 2019-07-02 DIAGNOSIS — Z23 Encounter for immunization: Secondary | ICD-10-CM

## 2019-07-02 NOTE — Progress Notes (Signed)
   Covid-19 Vaccination Clinic  Name:  Jody Roberts    MRN: [id number] DOB: 15-Jun-1985  07/02/2019  Ms. Jody Roberts was observed post Covid-19 immunization for 15 minutes without incident. She was provided with Vaccine Information Sheet and instruction to access the V-Safe system.   Ms. Jody Roberts was instructed to call 911 with any severe reactions post vaccine: Marland Kitchen Difficulty breathing  . Swelling of face and throat  . A fast heartbeat  . A bad rash all over body  . Dizziness and weakness   Immunizations Administered    Name Date Dose VIS Date Route   Moderna COVID-19 Vaccine 07/02/2019  9:24 AM 0.5 mL 03/10/2019 Intramuscular   Manufacturer: Moderna   Lot: [id number]   NDC: 16553-748-27

## 2019-08-04 ENCOUNTER — Ambulatory Visit: Payer: Medicaid Other | Attending: Internal Medicine

## 2019-08-04 DIAGNOSIS — Z23 Encounter for immunization: Secondary | ICD-10-CM

## 2019-08-04 NOTE — Progress Notes (Signed)
   Covid-19 Vaccination Clinic  Name:  Jody Roberts    MRN: [id number] DOB: 02-22-1986  08/04/2019  Ms. Jody Roberts was observed post Covid-19 immunization for 15 minutes without incident. She was provided with Vaccine Information Sheet and instruction to access the V-Safe system.   Ms. Jody Roberts was instructed to call 911 with any severe reactions post vaccine: Marland Kitchen Difficulty breathing  . Swelling of face and throat  . A fast heartbeat  . A bad rash all over body  . Dizziness and weakness   Immunizations Administered    Name Date Dose VIS Date Route   Moderna COVID-19 Vaccine 08/04/2019  8:48 AM 0.5 mL 03/2019 Intramuscular   Manufacturer: Moderna   Lot: [id number]   NDC: 08676-195-09

## 2019-08-11 ENCOUNTER — Ambulatory Visit: Payer: Medicaid Other

## 2021-04-26 ENCOUNTER — Other Ambulatory Visit: Payer: Self-pay

## 2021-04-26 ENCOUNTER — Emergency Department
Admission: EM | Admit: 2021-04-26 | Discharge: 2021-04-26 | Disposition: A | Discharge status: Home / Self Care | Payer: Medicaid Other | Attending: Emergency Medicine | Admitting: Emergency Medicine

## 2021-04-26 ENCOUNTER — Emergency Department: Payer: Medicaid Other

## 2021-04-26 DIAGNOSIS — Z20822 Contact with and (suspected) exposure to covid-19: Secondary | ICD-10-CM | POA: Diagnosis not present

## 2021-04-26 DIAGNOSIS — R1030 Lower abdominal pain, unspecified: Secondary | ICD-10-CM

## 2021-04-26 DIAGNOSIS — R1032 Left lower quadrant pain: Secondary | ICD-10-CM | POA: Diagnosis present

## 2021-04-26 DIAGNOSIS — N83209 Unspecified ovarian cyst, unspecified side: Secondary | ICD-10-CM | POA: Insufficient documentation

## 2021-04-26 DIAGNOSIS — R110 Nausea: Secondary | ICD-10-CM | POA: Insufficient documentation

## 2021-04-26 LAB — COMPREHENSIVE METABOLIC PANEL
ALT: 22 U/L (ref 0–44)
AST: 24 U/L (ref 15–41)
Albumin: 3.9 g/dL (ref 3.5–5.0)
Alkaline Phosphatase: 47 U/L (ref 38–126)
Anion gap: 7 (ref 5–15)
BUN: 11 mg/dL (ref 6–20)
CO2: 22 mmol/L (ref 22–32)
Calcium: 9.1 mg/dL (ref 8.9–10.3)
Chloride: 106 mmol/L (ref 98–111)
Creatinine, Ser: 0.83 mg/dL (ref 0.44–1.00)
GFR, Estimated: >60 mL/min (ref >60)
Glucose, Bld: 108 mg/dL — ABNORMAL HIGH (ref 70–99)
Potassium: 3.8 mmol/L (ref 3.5–5.1)
Sodium: 135 mmol/L (ref 135–145)
Total Bilirubin: 0.8 mg/dL (ref 0.3–1.2)
Total Protein: 7.4 g/dL (ref 6.5–8.1)

## 2021-04-26 LAB — URINALYSIS, ROUTINE W REFLEX MICROSCOPIC
Bacteria, UA: NONE SEEN
Bilirubin Urine: NEGATIVE
Glucose, UA: NEGATIVE mg/dL
Ketones, ur: NEGATIVE mg/dL
Leukocytes,Ua: NEGATIVE
Nitrite: NEGATIVE
Protein, ur: NEGATIVE mg/dL
Specific Gravity, Urine: 1.024 (ref 1.005–1.030)
pH: 5 (ref 5.0–8.0)

## 2021-04-26 LAB — RESP PANEL BY RT-PCR (FLU A&B, COVID) ARPGX2
Influenza A by PCR: NEGATIVE
Influenza B by PCR: NEGATIVE
SARS Coronavirus 2 by RT PCR: NEGATIVE

## 2021-04-26 LAB — CBC
HCT: 43.9 % (ref 36.0–46.0)
Hemoglobin: 15.5 g/dL — ABNORMAL HIGH (ref 12.0–15.0)
MCH: 32.2 pg (ref 26.0–34.0)
MCHC: 35.3 g/dL (ref 30.0–36.0)
MCV: 91.3 fL (ref 80.0–100.0)
Platelets: 252 10*3/uL (ref 150–400)
RBC: 4.81 MIL/uL (ref 3.87–5.11)
RDW: 13 % (ref 11.5–15.5)
WBC: 9 10*3/uL (ref 4.0–10.5)
nRBC: 0 % (ref 0.0–0.2)

## 2021-04-26 LAB — LIPASE, BLOOD: Lipase: 32 U/L (ref 11–51)

## 2021-04-26 LAB — PREGNANCY, URINE: Preg Test, Ur: NEGATIVE

## 2021-04-26 MED ORDER — IOHEXOL 300 MG/ML  SOLN
100.0000 mL | Freq: Once | INTRAMUSCULAR | Status: AC | PRN
  Administered 2021-04-26: 100 mL via INTRAVENOUS

## 2021-04-26 MED ORDER — HYDROMORPHONE HCL 1 MG/ML IJ SOLN
1.0000 mg | Freq: Once | INTRAMUSCULAR | Status: AC
  Administered 2021-04-26: 1 mg via INTRAVENOUS
  Filled 2021-04-26: qty 1

## 2021-04-26 MED ORDER — ONDANSETRON HCL 4 MG/2ML IJ SOLN
4.0000 mg | Freq: Once | INTRAMUSCULAR | Status: AC
  Administered 2021-04-26: 4 mg via INTRAVENOUS
  Filled 2021-04-26: qty 2

## 2021-04-26 MED ORDER — HYDROCODONE-ACETAMINOPHEN 5-325 MG PO TABS: 1.0000 | ORAL_TABLET | ORAL | 0 refills | Status: AC | PRN

## 2021-04-26 NOTE — ED Triage Notes (Signed)
Pt comes with c/o LLQ pain and nausea for few days, pt denies any vomiting or diarrhea. Pt denies any urinary symptoms.

## 2021-04-26 NOTE — ED Notes (Signed)
Patient to CT at this time

## 2021-04-26 NOTE — ED Provider Notes (Addendum)
[facility] Provider Note    Event Date/Time   First MD Initiated Contact with Patient 04/26/21 [phone]     (approximate)   History   Abdominal Pain   HPI Jody Roberts is a 36 y.o. female with a stated past medical history of hysterectomy and ovarian cysts who presents for left lower quadrant abdominal pain that has been present for the last 2 weeks and acutely worsened this morning.  Patient states that she felt like she was having abdominal cramping similar to premenstrual cramps however she does not have a uterus.  Patient states that she felt like she needed to have a bowel movement this morning and when she attempted to go with the pain acutely worsened.  Patient states that she has had a ruptured ovarian cyst in the past that feels similar.  Patient endorses nausea without vomiting.  Patient denies any diarrhea, chest pain, shortness of breath, fever     Physical Exam   Triage Vital Signs: ED Triage Vitals  Enc Vitals Group     BP 04/26/21 0710 (!) 142/97     Pulse Rate 04/26/21 0710 (!) 118     Resp 04/26/21 0710 18     Temp 04/26/21 0710 99.6 F (37.6 C)     Temp src --      SpO2 04/26/21 0710 97 %     Weight --      Height --      Head Circumference --      Peak Flow --      Pain Score 04/26/21 0709 8     Pain Loc --      Pain Edu? --      Excl. in [place]? --     Most recent vital signs: Vitals:   04/26/21 0710 04/26/21 0800  BP: (!) 142/97 (!) 111/57  Pulse: (!) 118 81  Resp: 18 18  Temp: 99.6 F (37.6 C)   SpO2: 97% 98%    General: Awake, mild distress secondary to pain  CV:  Good peripheral perfusion.  Resp:  Normal effort.  Abd:  No distention.  Tenderness palpation over left lower and suprapubic regions Other:  Middle-aged Caucasian female rocking in bed secondary to abdominal pain  ED Results / Procedures / Treatments   Labs (all labs ordered are listed, but only abnormal results are displayed) Labs Reviewed   COMPREHENSIVE METABOLIC PANEL - Abnormal; Notable for the following components:      Result Value   Glucose, Bld 108 (*)    All other components within normal limits  CBC - Abnormal; Notable for the following components:   Hemoglobin 15.5 (*)    All other components within normal limits  URINALYSIS, ROUTINE W REFLEX MICROSCOPIC - Abnormal; Notable for the following components:   Color, Urine YELLOW (*)    APPearance CLOUDY (*)    Hgb urine dipstick MODERATE (*)    All other components within normal limits  RESP PANEL BY RT-PCR (FLU A&B, COVID) ARPGX2  LIPASE, BLOOD  PREGNANCY, URINE  POC URINE PREG, ED   RADIOLOGY ED MD interpretation: CT of the abdomen and pelvis with and without contrast shows wall thickening in the descending and proximal sigmoid colon.  There also shows evidence of a collapsed ovarian cyst possibly consistent with a ruptured ovarian cyst.  Agree with radiology assessment  Official radiology report(s): CT ABDOMEN PELVIS W WO CONTRAST  Result Date: 04/26/2021 CLINICAL DATA:  Nausea and vomiting EXAM: CT ABDOMEN AND  PELVIS WITHOUT AND WITH CONTRAST TECHNIQUE: Multidetector CT imaging of the abdomen and pelvis was performed following the standard protocol before and following the bolus administration of intravenous contrast. RADIATION DOSE REDUCTION: This exam was performed according to the departmental dose-optimization program which includes automated exposure control, adjustment of the mA and/or kV according to patient size and/or use of iterative reconstruction technique. CONTRAST:  144mL OMNIPAQUE IOHEXOL 300 MG/ML  SOLN COMPARISON:  Multiple exams, including 09/09/2017 FINDINGS: Lower chest: Mild dependent subsegmental atelectasis in both lower lobes. Hepatobiliary: Cholecystectomy. No substantial biliary dilatation. Otherwise unremarkable. Pancreas: Unremarkable Spleen: Unremarkable Adrenals/Urinary Tract: Unremarkable. Vascular calcifications adjacent to the left  distal ureter but not felt to be within the ureter. Stomach/Bowel: Mild wall thickening in the descending and proximal sigmoid colon although some of this appearance may be from nondistention. No dilated bowel. The appendix appears normal. Vascular/Lymphatic: Unremarkable Reproductive: 2.6 by 1.9 cm left ovarian complex lesion with mild marginal enhancement and a somewhat wavy contour suggesting a collapsed cyst or corpus luteum. Uterus absent. Right ovary not well seen. Other: Trace right eccentric free pelvic fluid, probably physiologic, image 73 series 7. Musculoskeletal: Small umbilical hernia contains adipose tissue, image 56 series 10. IMPRESSION: 1. Wall thickening in the descending and proximal sigmoid colon may be from low-grade colitis or nondistention. No dilated bowel identified. 2. 2.6 by 1.9 cm left ovarian collapsed cyst versus corpus luteum. 3. Small umbilical hernia contains adipose tissue. Electronically Signed   By: Van Clines M.D.   On: 04/26/2021 09:24      PROCEDURES:  Critical Care performed: No  .1-3 Lead EKG Interpretation Performed by: Naaman Plummer, MD Authorized by: Naaman Plummer, MD     Interpretation: normal     ECG rate:  80   ECG rate assessment: normal     Rhythm: sinus rhythm     Ectopy: none     Conduction: normal     MEDICATIONS ORDERED IN ED: Medications  ondansetron (ZOFRAN) injection 4 mg (4 mg Intravenous Given 04/26/21 0833)  HYDROmorphone (DILAUDID) injection 1 mg (1 mg Intravenous Given 04/26/21 0833)  iohexol (OMNIPAQUE) 300 MG/ML solution 100 mL (100 mLs Intravenous Contrast Given 04/26/21 0843)     IMPRESSION / MDM / ASSESSMENT AND PLAN / ED COURSE  I reviewed the triage vital signs and the nursing notes.                              The patient is on the cardiac monitor to evaluate for evidence of arrhythmia and/or significant heart rate changes.  Patients symptoms not typical for emergent causes of abdominal pain such as,  but not limited to, appendicitis, abdominal aortic aneurysm, surgical biliary disease, pancreatitis, SBO, mesenteric ischemia, serious intra-abdominal bacterial illness. Presentation also not typical of gynecologic emergencies such as TOA, Ovarian Torsion, PID. Not Ectopic. Doubt atypical ACS. CMP, CBC does not show any evidence of acute abnormalities.  UA only shows evidence of moderate hemoglobin which fits with possible diagnosis of ruptured ovarian cyst CT of the abdomen and pelvis showing sigmoid colitis however patient does not have any history of diarrhea or fever as well as white count normal.  CT also shows deflated corpus luteal cyst on the ovary likely secondary to a ruptured ovarian cyst and possibly cause of this patient's pain. Per a note from 08/17/2015, patient does have history of ruptured ovarian cyst in the past Pt tolerating PO. Disposition: Patient will be  discharged with strict return precautions and follow up with primary MD within 12-24 hours for further evaluation. Patient understands that this still may have an early presentation of an emergent medical condition such as appendicitis that will require a recheck.      FINAL CLINICAL IMPRESSION(S) / ED DIAGNOSES   Final diagnoses:  Lower abdominal pain  Nausea  Ruptured ovarian cyst     Rx / DC Orders   ED Discharge Orders          Ordered    HYDROcodone-acetaminophen (NORCO) 5-325 MG tablet  Every 4 hours PRN        04/26/21 0942             Note:  This document was prepared using Dragon voice recognition software and may include unintentional dictation errors.   Naaman Plummer, MD 04/26/21 [id number]    Naaman Plummer, MD 04/26/21 [phone]

## 2023-05-04 IMAGING — CT CT ABD-PEL WO/W CM
2 of 8 series · 13 of 46 positions shown, 18 images · IV contrast (APPLIED)
Comparison: Multiple exams, including 09/09/2017

CLINICAL DATA: Nausea and vomiting

EXAM:
CT ABDOMEN AND PELVIS WITHOUT AND WITH CONTRAST
TECHNIQUE: Multidetector CT imaging of the abdomen and pelvis was performed
following the standard protocol before and following the bolus
administration of intravenous contrast.

[Series 4: coronal st · coronal · 0.69mm/px · 3 of 85 slices shown]
[im 22/85  soft-tissue]
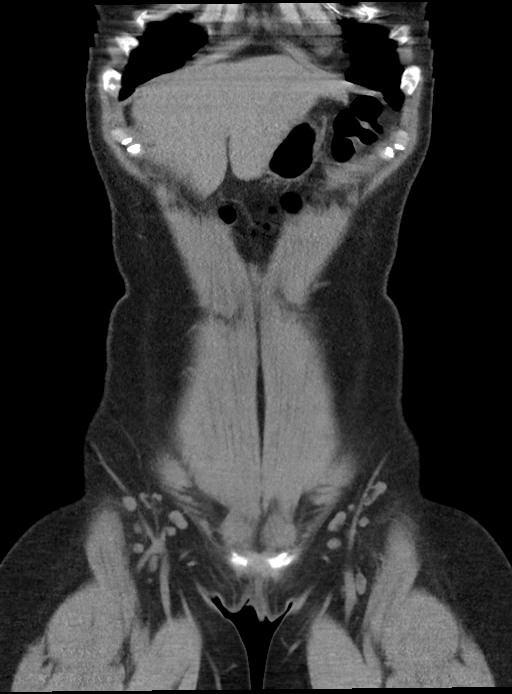
[im 43/85  soft-tissue]
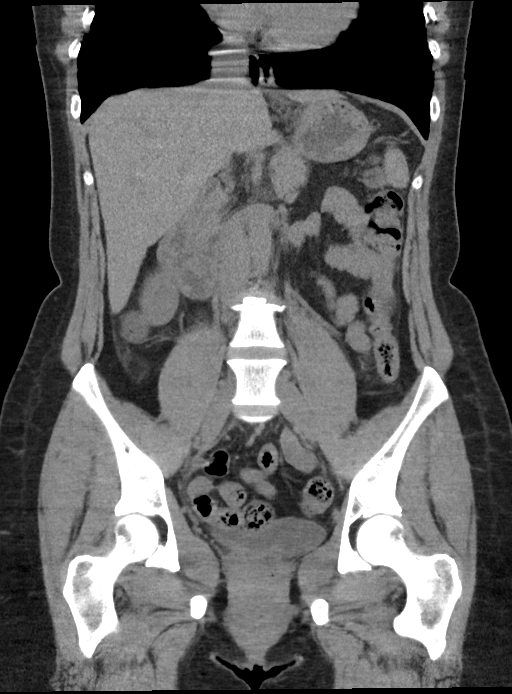
[im 64/85  soft-tissue]
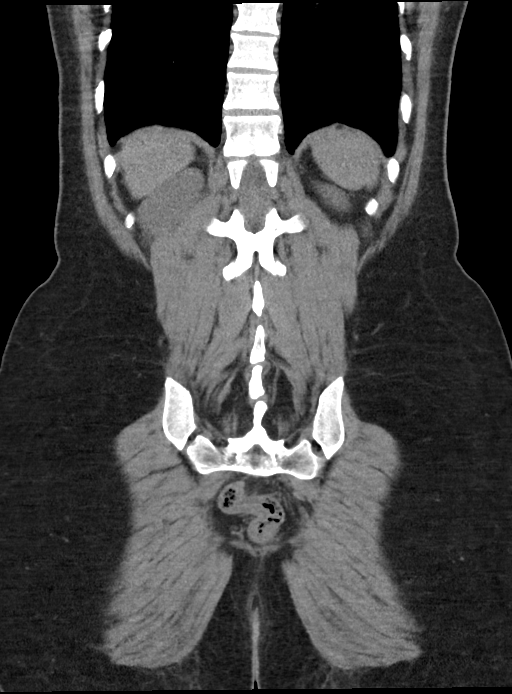

[Series 7: axial st · axial · 0.62mm/px · z∈[-960,-565]mm · 10 of 95 slices shown, 15 images]
[im 8/95  soft-tissue]
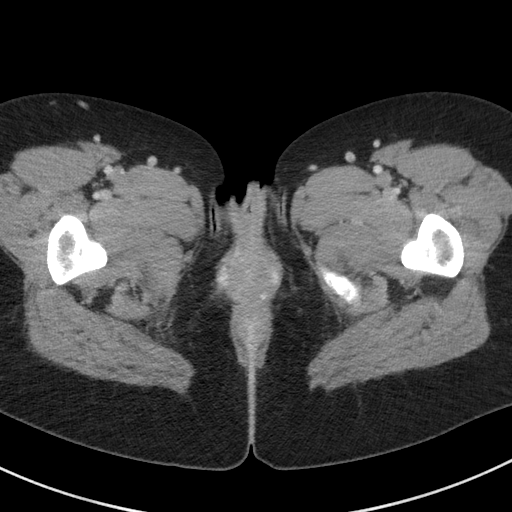
[im 8/95  bone]
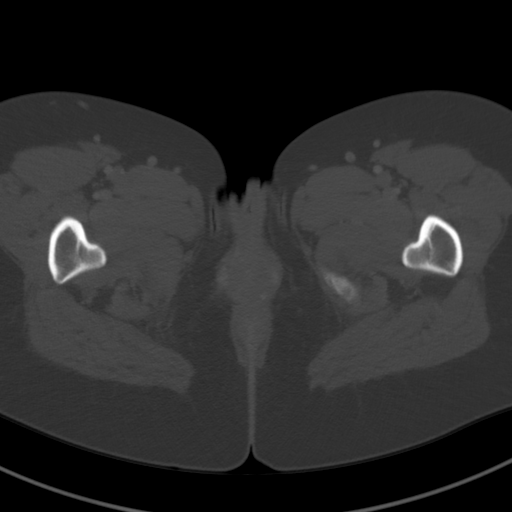
[im 16/95  soft-tissue]
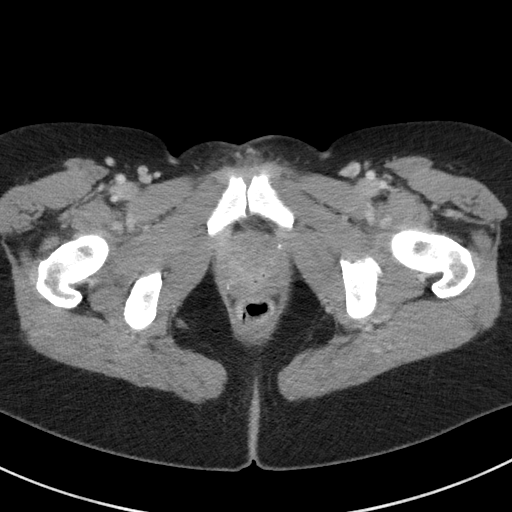
[im 32/95  soft-tissue]
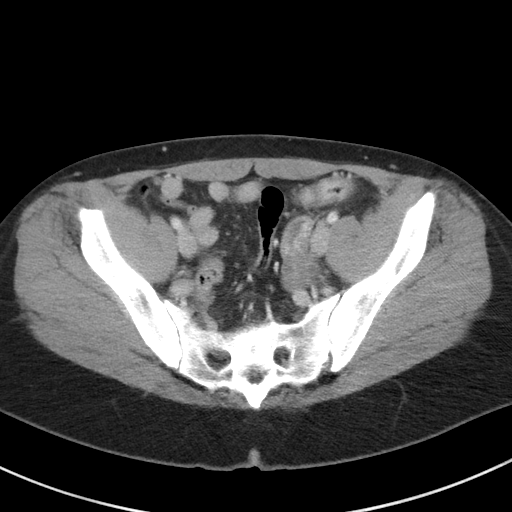
[im 40/95  soft-tissue]
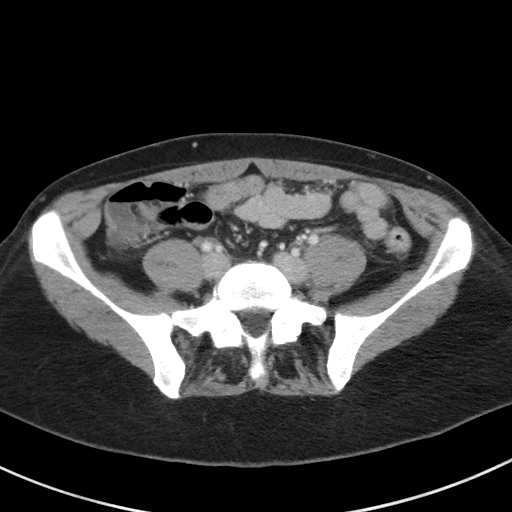
[im 48/95  soft-tissue]
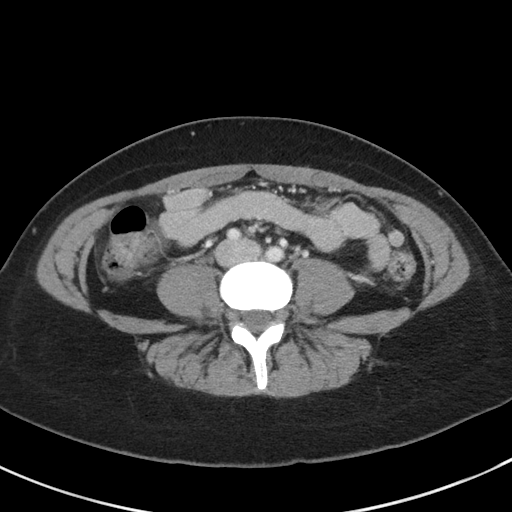
[im 55/95  soft-tissue]
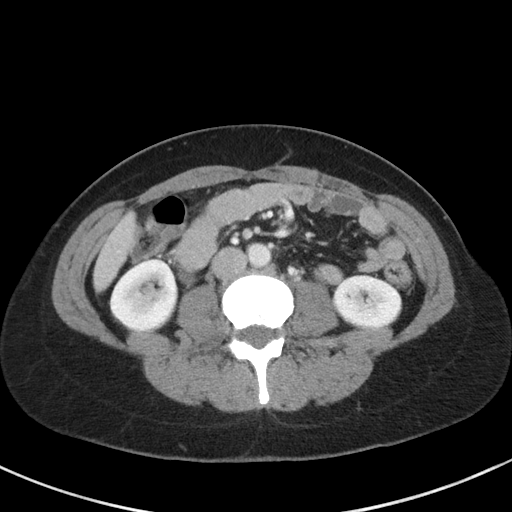
[im 63/95  soft-tissue]
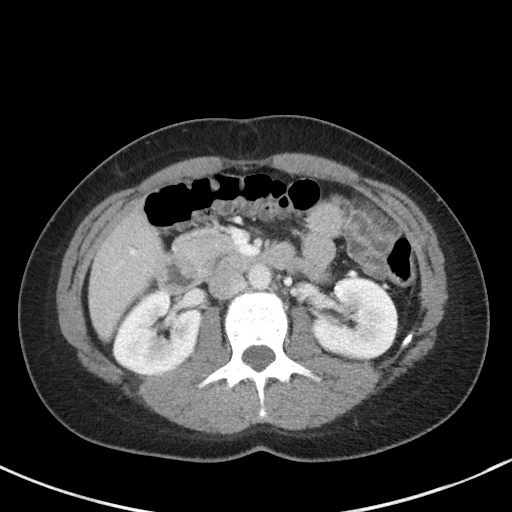
[im 63/95  lung]
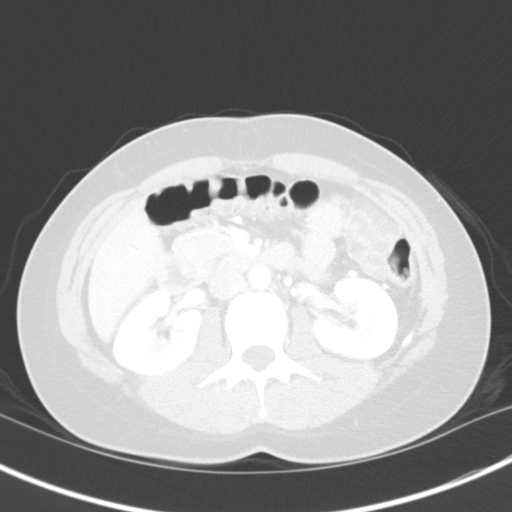
[im 71/95  lung]
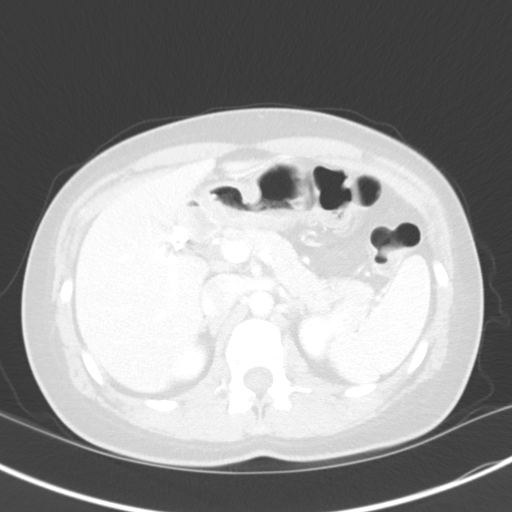
[im 79/95  soft-tissue]
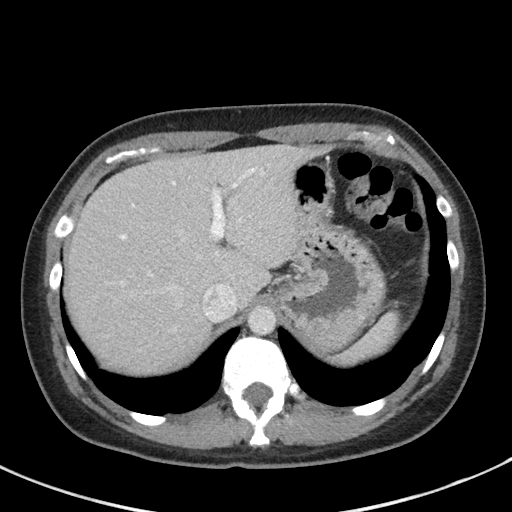
[im 79/95  lung]
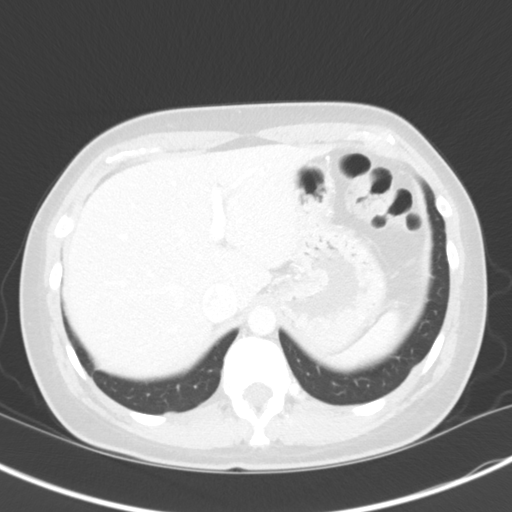
[im 87/95  soft-tissue]
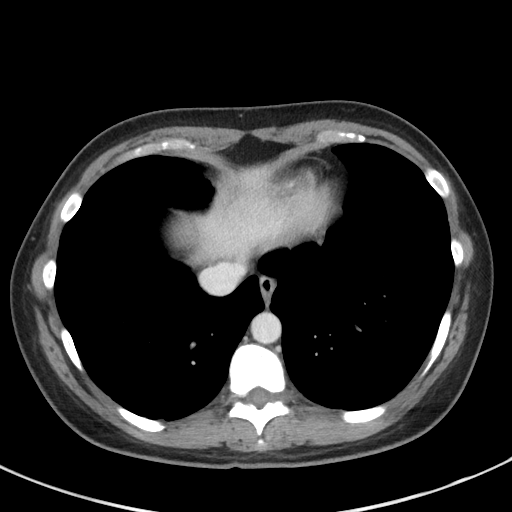
[im 87/95  lung]
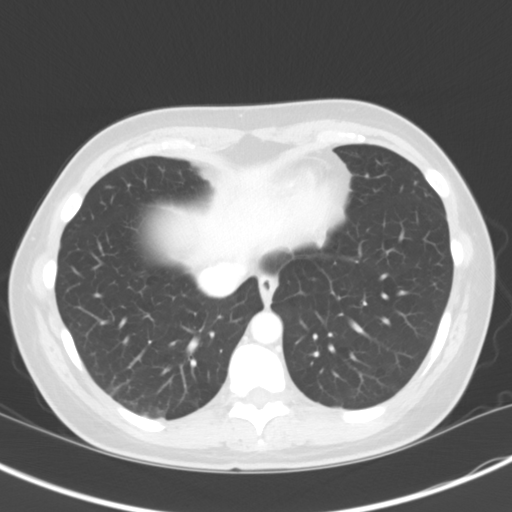
[im 87/95  bone]
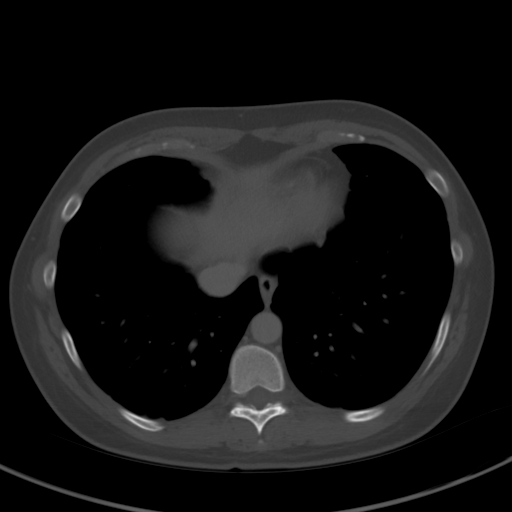

[13 of 46 positions shown; findings below may reference images not displayed]

RADIATION DOSE REDUCTION: This exam was performed according to the
departmental dose-optimization program which includes automated
exposure control, adjustment of the mA and/or kV according to
patient size and/or use of iterative reconstruction technique.

CONTRAST:  100mL OMNIPAQUE IOHEXOL 300 MG/ML  SOLN
FINDINGS: Lower chest: Mild dependent subsegmental atelectasis in both lower
lobes.

Hepatobiliary: Cholecystectomy. No substantial biliary dilatation.
Otherwise unremarkable.

Pancreas: Unremarkable

Spleen: Unremarkable

Adrenals/Urinary Tract: Unremarkable. Vascular calcifications
adjacent to the left distal ureter but not felt to be within the
ureter.

Stomach/Bowel: Mild wall thickening in the descending and proximal
sigmoid colon although some of this appearance may be from
nondistention. No dilated bowel. The appendix appears normal.

Vascular/Lymphatic: Unremarkable

Reproductive: 2.6 by 1.9 cm left ovarian complex lesion with mild
marginal enhancement and a somewhat wavy contour suggesting a
collapsed cyst or corpus luteum. Uterus absent. Right ovary not well
seen.

Other: Trace right eccentric free pelvic fluid, probably
physiologic, image 73 series 7.

Musculoskeletal: Small umbilical hernia contains adipose tissue,
image 56 series 10.
IMPRESSION: 1. Wall thickening in the descending and proximal sigmoid colon may
be from low-grade colitis or nondistention. No dilated bowel
identified.
2. 2.6 by 1.9 cm left ovarian collapsed cyst versus corpus luteum.
3. Small umbilical hernia contains adipose tissue.
